# Patient Record
Sex: Female | Born: 1985 | Race: Black or African American | Hispanic: No | Marital: Single | Smoking: Current some day smoker
Health system: Southern US, Community
[De-identification: ages and names within clinical notes are randomized; demographics above are authoritative.]

## PROBLEM LIST (undated history)

## (undated) DIAGNOSIS — Q501 Developmental ovarian cyst: Secondary | ICD-10-CM

## (undated) DIAGNOSIS — J45909 Unspecified asthma, uncomplicated: Secondary | ICD-10-CM

## (undated) DIAGNOSIS — F419 Anxiety disorder, unspecified: Secondary | ICD-10-CM

## (undated) DIAGNOSIS — I10 Essential (primary) hypertension: Secondary | ICD-10-CM

## (undated) DIAGNOSIS — D219 Benign neoplasm of connective and other soft tissue, unspecified: Secondary | ICD-10-CM

---
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
## 2008-02-01 ENCOUNTER — Emergency Department (HOSPITAL_COMMUNITY): Admission: EM | Admit: 2008-02-01 | Discharge: 2008-02-01 | Payer: Self-pay | Admitting: Family Medicine

## 2008-04-11 ENCOUNTER — Emergency Department (HOSPITAL_COMMUNITY): Admission: EM | Admit: 2008-04-11 | Discharge: 2008-04-11 | Payer: Self-pay | Admitting: Emergency Medicine

## 2008-10-18 ENCOUNTER — Emergency Department (HOSPITAL_COMMUNITY): Admission: EM | Admit: 2008-10-18 | Discharge: 2008-10-18 | Payer: Self-pay | Admitting: Emergency Medicine

## 2008-10-22 ENCOUNTER — Inpatient Hospital Stay (HOSPITAL_COMMUNITY): Admission: AD | Admit: 2008-10-22 | Discharge: 2008-10-22 | Payer: Self-pay | Admitting: Obstetrics & Gynecology

## 2008-12-26 ENCOUNTER — Emergency Department (HOSPITAL_COMMUNITY): Admission: EM | Admit: 2008-12-26 | Discharge: 2008-12-26 | Payer: Self-pay | Admitting: Emergency Medicine

## 2009-01-23 ENCOUNTER — Emergency Department (HOSPITAL_COMMUNITY): Admission: EM | Admit: 2009-01-23 | Discharge: 2009-01-23 | Payer: Self-pay | Admitting: Emergency Medicine

## 2009-06-22 ENCOUNTER — Emergency Department (HOSPITAL_COMMUNITY): Admission: EM | Admit: 2009-06-22 | Discharge: 2009-06-22 | Payer: Self-pay | Admitting: Emergency Medicine

## 2009-10-31 ENCOUNTER — Emergency Department (HOSPITAL_COMMUNITY): Admission: EM | Admit: 2009-10-31 | Discharge: 2009-10-31 | Payer: Self-pay | Admitting: Emergency Medicine

## 2009-12-19 ENCOUNTER — Emergency Department (HOSPITAL_COMMUNITY): Admission: EM | Admit: 2009-12-19 | Discharge: 2009-12-19 | Payer: Self-pay | Admitting: Family Medicine

## 2010-11-01 ENCOUNTER — Emergency Department (HOSPITAL_COMMUNITY)
Admission: EM | Admit: 2010-11-01 | Discharge: 2010-11-02 | Payer: Self-pay | Source: Home / Self Care | Admitting: Emergency Medicine

## 2010-11-04 LAB — URINALYSIS, ROUTINE W REFLEX MICROSCOPIC
Bilirubin Urine: NEGATIVE
Nitrite: NEGATIVE
Specific Gravity, Urine: 1.02 (ref 1.005–1.030)
pH: 6.5 (ref 5.0–8.0)

## 2010-11-04 LAB — POCT PREGNANCY, URINE: Preg Test, Ur: NEGATIVE

## 2010-12-28 LAB — URINALYSIS, ROUTINE W REFLEX MICROSCOPIC
Nitrite: NEGATIVE
Protein, ur: NEGATIVE mg/dL
Specific Gravity, Urine: 1.03 (ref 1.005–1.030)
Urobilinogen, UA: 0.2 mg/dL (ref 0.0–1.0)

## 2010-12-28 LAB — POCT PREGNANCY, URINE: Preg Test, Ur: NEGATIVE

## 2011-01-04 LAB — WET PREP, GENITAL
Clue Cells Wet Prep HPF POC: NONE SEEN
Trich, Wet Prep: NONE SEEN
Yeast Wet Prep HPF POC: NONE SEEN

## 2011-01-04 LAB — POCT URINALYSIS DIP (DEVICE)
Glucose, UA: NEGATIVE mg/dL
Nitrite: NEGATIVE
Protein, ur: NEGATIVE mg/dL
Urobilinogen, UA: 0.2 mg/dL (ref 0.0–1.0)

## 2011-01-04 LAB — GC/CHLAMYDIA PROBE AMP, GENITAL: GC Probe Amp, Genital: NEGATIVE

## 2011-01-04 LAB — POCT PREGNANCY, URINE: Preg Test, Ur: NEGATIVE

## 2011-01-16 LAB — URINE MICROSCOPIC-ADD ON

## 2011-01-16 LAB — URINALYSIS, ROUTINE W REFLEX MICROSCOPIC
Glucose, UA: NEGATIVE mg/dL
Hgb urine dipstick: NEGATIVE
Specific Gravity, Urine: 1.038 — ABNORMAL HIGH (ref 1.005–1.030)
Urobilinogen, UA: 1 mg/dL (ref 0.0–1.0)

## 2011-01-16 LAB — GC/CHLAMYDIA PROBE AMP, GENITAL
Chlamydia, DNA Probe: POSITIVE — AB
GC Probe Amp, Genital: POSITIVE — AB

## 2011-01-16 LAB — WET PREP, GENITAL
Clue Cells Wet Prep HPF POC: NONE SEEN
Trich, Wet Prep: NONE SEEN

## 2011-01-16 LAB — POCT PREGNANCY, URINE: Preg Test, Ur: NEGATIVE

## 2011-01-21 LAB — BASIC METABOLIC PANEL
CO2: 23 mEq/L (ref 19–32)
Calcium: 9.7 mg/dL (ref 8.4–10.5)
Chloride: 101 mEq/L (ref 96–112)
GFR calc Af Amer: >60 mL/min (ref >60)
Glucose, Bld: 114 mg/dL — ABNORMAL HIGH (ref 70–99)
Potassium: 3.4 mEq/L — ABNORMAL LOW (ref 3.5–5.1)
Sodium: 135 mEq/L (ref 135–145)

## 2011-01-21 LAB — URINALYSIS, ROUTINE W REFLEX MICROSCOPIC
Glucose, UA: NEGATIVE mg/dL
Ketones, ur: 15 mg/dL — AB
Nitrite: NEGATIVE
Protein, ur: NEGATIVE mg/dL
Urobilinogen, UA: 0.2 mg/dL (ref 0.0–1.0)

## 2011-01-21 LAB — WET PREP, GENITAL: Clue Cells Wet Prep HPF POC: NONE SEEN

## 2011-01-21 LAB — DIFFERENTIAL
Basophils Relative: 1 % (ref 0–1)
Eosinophils Relative: 2 % (ref 0–5)
Monocytes Absolute: 0.6 10*3/uL (ref 0.1–1.0)
Monocytes Relative: 6 % (ref 3–12)
Neutro Abs: 7.8 10*3/uL — ABNORMAL HIGH (ref 1.7–7.7)

## 2011-01-21 LAB — URINE MICROSCOPIC-ADD ON

## 2011-01-21 LAB — CBC
HCT: 42 % (ref 36.0–46.0)
Hemoglobin: 13.9 g/dL (ref 12.0–15.0)
MCHC: 33.2 g/dL (ref 30.0–36.0)
MCV: 79.6 fL (ref 78.0–100.0)
RBC: 5.28 MIL/uL — ABNORMAL HIGH (ref 3.87–5.11)
RDW: 14.1 % (ref 11.5–15.5)

## 2011-01-21 LAB — GC/CHLAMYDIA PROBE AMP, GENITAL: Chlamydia, DNA Probe: POSITIVE — AB

## 2011-01-26 LAB — POCT PREGNANCY, URINE: Preg Test, Ur: NEGATIVE

## 2011-06-30 ENCOUNTER — Emergency Department (HOSPITAL_COMMUNITY)
Admission: EM | Admit: 2011-06-30 | Discharge: 2011-07-01 | Disposition: A | Discharge status: Home / Self Care | Payer: Self-pay | Attending: Emergency Medicine | Admitting: Emergency Medicine

## 2011-06-30 DIAGNOSIS — R197 Diarrhea, unspecified: Secondary | ICD-10-CM | POA: Insufficient documentation

## 2011-06-30 DIAGNOSIS — R112 Nausea with vomiting, unspecified: Secondary | ICD-10-CM | POA: Insufficient documentation

## 2011-06-30 DIAGNOSIS — R109 Unspecified abdominal pain: Secondary | ICD-10-CM | POA: Insufficient documentation

## 2011-06-30 DIAGNOSIS — R51 Headache: Secondary | ICD-10-CM | POA: Insufficient documentation

## 2011-06-30 DIAGNOSIS — R6883 Chills (without fever): Secondary | ICD-10-CM | POA: Insufficient documentation

## 2011-07-01 LAB — DIFFERENTIAL
Basophils Absolute: 0 10*3/uL (ref 0.0–0.1)
Basophils Relative: 0 % (ref 0–1)
Eosinophils Absolute: 0.2 10*3/uL (ref 0.0–0.7)
Eosinophils Relative: 2 % (ref 0–5)
Monocytes Absolute: 1 10*3/uL (ref 0.1–1.0)
Monocytes Relative: 8 % (ref 3–12)

## 2011-07-01 LAB — CBC
Hemoglobin: 12.3 g/dL (ref 12.0–15.0)
MCH: 26.2 pg (ref 26.0–34.0)
MCHC: 34.1 g/dL (ref 30.0–36.0)
Platelets: 259 10*3/uL (ref 150–400)
RDW: 14.2 % (ref 11.5–15.5)

## 2011-07-01 LAB — BASIC METABOLIC PANEL
Calcium: 9.7 mg/dL (ref 8.4–10.5)
GFR calc Af Amer: >60 mL/min (ref >60)
GFR calc non Af Amer: >60 mL/min (ref >60)
Glucose, Bld: 93 mg/dL (ref 70–99)
Potassium: 3.2 mEq/L — ABNORMAL LOW (ref 3.5–5.1)
Sodium: 136 mEq/L (ref 135–145)

## 2011-07-01 LAB — URINALYSIS, ROUTINE W REFLEX MICROSCOPIC
Bilirubin Urine: NEGATIVE
Ketones, ur: 15 mg/dL — AB
Leukocytes, UA: NEGATIVE
Nitrite: NEGATIVE
Protein, ur: NEGATIVE mg/dL
Urobilinogen, UA: 1 mg/dL (ref 0.0–1.0)
pH: 7 (ref 5.0–8.0)

## 2011-07-07 LAB — POCT URINALYSIS DIP (DEVICE)
Ketones, ur: NEGATIVE
Protein, ur: NEGATIVE
Urobilinogen, UA: 0.2
pH: 5.5

## 2011-07-07 LAB — POCT PREGNANCY, URINE: Operator id: 239701

## 2011-07-09 LAB — DIFFERENTIAL
Basophils Relative: 1
Eosinophils Absolute: 0.2
Eosinophils Relative: 2
Monocytes Absolute: 0.5
Monocytes Relative: 7

## 2011-07-09 LAB — POCT URINALYSIS DIP (DEVICE)
Bilirubin Urine: NEGATIVE
Glucose, UA: NEGATIVE
Hgb urine dipstick: NEGATIVE
Specific Gravity, Urine: 1.015
Urobilinogen, UA: 1
pH: 8

## 2011-07-09 LAB — POCT I-STAT, CHEM 8
Calcium, Ion: 1.15
Glucose, Bld: 95
HCT: 44
Hemoglobin: 15
TCO2: 26

## 2011-07-09 LAB — CBC
Hemoglobin: 13.8
MCHC: 32.9
MCV: 80.7
RBC: 5.2 — ABNORMAL HIGH

## 2012-06-24 ENCOUNTER — Encounter (HOSPITAL_COMMUNITY): Payer: Self-pay

## 2012-06-24 ENCOUNTER — Emergency Department (HOSPITAL_COMMUNITY): Payer: Self-pay

## 2012-06-24 ENCOUNTER — Emergency Department (HOSPITAL_COMMUNITY)
Admission: EM | Admit: 2012-06-24 | Discharge: 2012-06-24 | Disposition: A | Discharge status: Home / Self Care | Payer: Self-pay | Attending: Emergency Medicine | Admitting: Emergency Medicine

## 2012-06-24 DIAGNOSIS — N898 Other specified noninflammatory disorders of vagina: Secondary | ICD-10-CM | POA: Insufficient documentation

## 2012-06-24 DIAGNOSIS — I10 Essential (primary) hypertension: Secondary | ICD-10-CM | POA: Insufficient documentation

## 2012-06-24 DIAGNOSIS — R109 Unspecified abdominal pain: Secondary | ICD-10-CM | POA: Insufficient documentation

## 2012-06-24 DIAGNOSIS — R112 Nausea with vomiting, unspecified: Secondary | ICD-10-CM | POA: Insufficient documentation

## 2012-06-24 DIAGNOSIS — N730 Acute parametritis and pelvic cellulitis: Secondary | ICD-10-CM | POA: Insufficient documentation

## 2012-06-24 HISTORY — DX: Essential (primary) hypertension: I10

## 2012-06-24 LAB — CBC WITH DIFFERENTIAL/PLATELET
Eosinophils Absolute: 0.2 10*3/uL (ref 0.0–0.7)
Eosinophils Relative: 3 % (ref 0–5)
Lymphs Abs: 2.7 10*3/uL (ref 0.7–4.0)
MCH: 26.6 pg (ref 26.0–34.0)
MCV: 78.9 fL (ref 78.0–100.0)
Platelets: 278 10*3/uL (ref 150–400)
RBC: 5.3 MIL/uL — ABNORMAL HIGH (ref 3.87–5.11)

## 2012-06-24 LAB — URINALYSIS, ROUTINE W REFLEX MICROSCOPIC
Bilirubin Urine: NEGATIVE
Ketones, ur: NEGATIVE mg/dL
Nitrite: NEGATIVE
Urobilinogen, UA: 1 mg/dL (ref 0.0–1.0)
pH: 6 (ref 5.0–8.0)

## 2012-06-24 LAB — COMPREHENSIVE METABOLIC PANEL
ALT: 11 U/L (ref 0–35)
BUN: 11 mg/dL (ref 6–23)
Calcium: 9.7 mg/dL (ref 8.4–10.5)
Creatinine, Ser: 0.85 mg/dL (ref 0.50–1.10)
GFR calc Af Amer: >90 mL/min (ref >90)
Glucose, Bld: 86 mg/dL (ref 70–99)
Sodium: 137 mEq/L (ref 135–145)
Total Protein: 7.6 g/dL (ref 6.0–8.3)

## 2012-06-24 LAB — WET PREP, GENITAL
Trich, Wet Prep: NONE SEEN
Yeast Wet Prep HPF POC: NONE SEEN

## 2012-06-24 MED ORDER — HYDROMORPHONE HCL PF 1 MG/ML IJ SOLN
1.0000 mg | Freq: Once | INTRAMUSCULAR | Status: AC
  Administered 2012-06-24: 1 mg via INTRAMUSCULAR
  Filled 2012-06-24: qty 1

## 2012-06-24 MED ORDER — CEFTRIAXONE SODIUM 250 MG IJ SOLR
250.0000 mg | Freq: Once | INTRAMUSCULAR | Status: AC
  Administered 2012-06-24: 250 mg via INTRAMUSCULAR
  Filled 2012-06-24: qty 250

## 2012-06-24 MED ORDER — ONDANSETRON 4 MG PO TBDP
4.0000 mg | ORAL_TABLET | Freq: Once | ORAL | Status: AC
  Administered 2012-06-24: 4 mg via ORAL

## 2012-06-24 MED ORDER — ONDANSETRON 4 MG PO TBDP
ORAL_TABLET | ORAL | Status: AC
  Administered 2012-06-24: 4 mg via ORAL
  Filled 2012-06-24: qty 1

## 2012-06-24 MED ORDER — DOXYCYCLINE HYCLATE 100 MG PO CAPS: 100.0000 mg | ORAL_CAPSULE | Freq: Two times a day (BID) | ORAL | Status: AC

## 2012-06-24 MED ORDER — ONDANSETRON HCL 4 MG PO TABS: 4.0000 mg | ORAL_TABLET | Freq: Four times a day (QID) | ORAL | Status: AC

## 2012-06-24 MED ORDER — AZITHROMYCIN 250 MG PO TABS
1000.0000 mg | ORAL_TABLET | Freq: Once | ORAL | Status: AC
  Administered 2012-06-24: 1000 mg via ORAL
  Filled 2012-06-24: qty 4

## 2012-06-24 MED ORDER — ONDANSETRON 4 MG PO TBDP
4.0000 mg | ORAL_TABLET | Freq: Once | ORAL | Status: AC
Start: 2012-06-24 — End: 2012-06-24
  Administered 2012-06-24: 4 mg via ORAL

## 2012-06-24 NOTE — ED Notes (Signed)
Pt to ultrasound at this time. No distress noted.

## 2012-06-24 NOTE — ED Provider Notes (Signed)
History     CSN: [id number]  Arrival date & time 06/24/12  1317   First MD Initiated Contact with Patient 06/24/12 1432      Chief Complaint  Patient presents with  . Nausea    (Consider location/radiation/quality/duration/timing/severity/associated sxs/prior treatment) HPI Comments: 26 year old female presents with abdominal pain, nausea and vomiting x2 days. Abdominal pains located in her lower abdomen and in her bellybutton. She rates the pain a 9/10 and is constant radiating to her back.. She has been nauseous and vomited a few times. Admits to associated vaginal bleeding. She states is not a lot of blood like her menstrual, just some spotting. Admits to being sexually active with one partner, does not use protection and is not on any birth control. She is feeling a little weak. Denies any dysuria, increased frequency or urgency. Denies fever, chills, lightheadedness, dizziness or headache. She has never been pregnant before. Her menstrual cycles are usually regular.  The history is provided by the patient.    Past Medical History  Diagnosis Date  . Hypertension     History reviewed. No pertinent past surgical history.  History reviewed. No pertinent family history.  History  Substance Use Topics  . Smoking status: Never Smoker   . Smokeless tobacco: Not on file  . Alcohol Use: No    OB History    Grav Para Term Preterm Abortions TAB SAB Ect Mult Living                  Review of Systems  Constitutional: Negative for fever and chills.  Respiratory: Negative for shortness of breath.   Cardiovascular: Negative for chest pain.  Gastrointestinal: Positive for nausea, vomiting and abdominal pain.  Genitourinary: Positive for vaginal bleeding, menstrual problem and pelvic pain. Negative for dysuria, urgency, frequency and vaginal pain.  Musculoskeletal: Positive for back pain.  Skin: Negative for color change.  Neurological: Positive for weakness. Negative for  dizziness and light-headedness.    Allergies  Review of patient's allergies indicates no known allergies.  Home Medications  No current outpatient prescriptions on file.  LMP 06/24/2012  Physical Exam  Constitutional: She is oriented to person, place, and time. She appears well-developed and well-nourished. No distress.  HENT:  Head: Normocephalic and atraumatic.  Eyes: Conjunctivae normal are normal.  Neck: Normal range of motion. Neck supple.  Cardiovascular: Normal rate, regular rhythm and normal heart sounds.   Pulmonary/Chest: Effort normal and breath sounds normal.  Abdominal: Soft. Bowel sounds are normal. There is tenderness in the periumbilical area and suprapubic area. There is no CVA tenderness.  Genitourinary: Uterus normal. Cervix exhibits motion tenderness and discharge. Cervix exhibits no friability. Right adnexum displays tenderness. Right adnexum displays no mass and no fullness. Left adnexum displays tenderness. Left adnexum displays no mass and no fullness. There is erythema and bleeding around the vagina. No tenderness around the vagina. Vaginal discharge (Malodorous) found.  Musculoskeletal: Normal range of motion. She exhibits no edema.  Neurological: She is alert and oriented to person, place, and time.  Skin: Skin is warm and dry. She is not diaphoretic.  Psychiatric: She has a normal mood and affect. Her behavior is normal.    ED Course  Procedures (including critical care time)   Labs Reviewed  POCT PREGNANCY, URINE  URINALYSIS, ROUTINE W REFLEX MICROSCOPIC  CBC WITH DIFFERENTIAL  COMPREHENSIVE METABOLIC PANEL  GC/CHLAMYDIA PROBE AMP, GENITAL  WET PREP, GENITAL   No results found.   No diagnosis found.    MDM  26 y/o female with abdominal/pelvic pain and vaginal bleeding. CMT present on pelvic exam along with malodorous discharge. Obtaining US pelvis due to adnexal tenderness bilaterally. Case discussed with Remi Haggard, PA-C who will take  over care of patient at this time.        Trevor Mace, PA-C 06/24/12 1549

## 2012-06-24 NOTE — ED Notes (Signed)
Pt c/o nausea, given zofran as ordered, will reassess prior to discharge.

## 2012-06-24 NOTE — ED Provider Notes (Signed)
1545 pm report received from [place] PA for this 26 year old complaining of nausea vomiting and lower abdominal pain. Patient has pending u/s to rule out adnexal abscess. Patient will be treated for PID. Disposition pending after ultrasound results.   1800  U/s negative for any acute process.  Patient will be discharged and follow up with pcp/gyn of choice or [facility].  No intercourse until rechecked in 5-7 days.  Partner needs to go to free std clinic as well for treatment. rx for doxycycline.  Remi Haggard, NP 06/25/12 1303

## 2012-06-24 NOTE — ED Notes (Signed)
Pt returned from ultrasound, no distress noted.

## 2012-06-24 NOTE — ED Notes (Signed)
Pt complains of nausea, and sts possible mis carriage but has not taken a pregnancy test

## 2012-06-24 NOTE — ED Notes (Signed)
Pt has not taken a preg test but Pt thinks she may be having a miscarriage.  Pt reports clots looking more like formed tissue than normal clots.  Pt alert oriented X4.

## 2012-06-27 LAB — GC/CHLAMYDIA PROBE AMP, GENITAL
Chlamydia, DNA Probe: NEGATIVE
GC Probe Amp, Genital: NEGATIVE

## 2012-06-27 NOTE — ED Provider Notes (Signed)
Medical screening examination/treatment/procedure(s) were performed by non-physician practitioner and as supervising physician I was immediately available for consultation/collaboration.   Kailee Essman M Orlinda Slomski, DO 06/27/12 0820 

## 2012-06-28 NOTE — ED Provider Notes (Signed)
Medical screening examination/treatment/procedure(s) were performed by non-physician practitioner and as supervising physician I was immediately available for consultation/collaboration.   Laray Anger, DO 06/28/12 1422

## 2013-01-30 ENCOUNTER — Encounter (HOSPITAL_COMMUNITY): Payer: Self-pay | Admitting: Emergency Medicine

## 2013-01-30 ENCOUNTER — Emergency Department (HOSPITAL_COMMUNITY)
Admission: EM | Admit: 2013-01-30 | Discharge: 2013-01-30 | Disposition: A | Discharge status: Home / Self Care | Payer: Self-pay | Attending: Emergency Medicine | Admitting: Emergency Medicine

## 2013-01-30 ENCOUNTER — Emergency Department (HOSPITAL_COMMUNITY): Payer: Self-pay

## 2013-01-30 DIAGNOSIS — Y929 Unspecified place or not applicable: Secondary | ICD-10-CM | POA: Insufficient documentation

## 2013-01-30 DIAGNOSIS — S20219A Contusion of unspecified front wall of thorax, initial encounter: Secondary | ICD-10-CM | POA: Insufficient documentation

## 2013-01-30 DIAGNOSIS — Y939 Activity, unspecified: Secondary | ICD-10-CM | POA: Insufficient documentation

## 2013-01-30 DIAGNOSIS — W010XXA Fall on same level from slipping, tripping and stumbling without subsequent striking against object, initial encounter: Secondary | ICD-10-CM | POA: Insufficient documentation

## 2013-01-30 DIAGNOSIS — S20212A Contusion of left front wall of thorax, initial encounter: Secondary | ICD-10-CM

## 2013-01-30 MED ORDER — OXYCODONE-ACETAMINOPHEN 5-325 MG PO TABS: ORAL_TABLET | ORAL | Status: DC

## 2013-01-30 MED ORDER — OXYCODONE-ACETAMINOPHEN 5-325 MG PO TABS
2.0000 | ORAL_TABLET | Freq: Once | ORAL | Status: AC
  Administered 2013-01-30: 2 via ORAL
  Filled 2013-01-30: qty 2

## 2013-01-30 NOTE — ED Provider Notes (Signed)
Medical screening examination/treatment/procedure(s) were performed by non-physician practitioner and as supervising physician I was immediately available for consultation/collaboration.    Saga Balthazar R Orly Quimby, MD 01/30/13 2231 

## 2013-01-30 NOTE — ED Notes (Addendum)
Pt tripped and fell today at home and is having rib pain on the left side that hurts more with each breat. Pain 7/10. Pt fell yesterday.

## 2013-01-30 NOTE — ED Provider Notes (Signed)
History    This chart was scribed for non-physician practitioner Wynetta Emery, PA-C working with Celene Kras, MD by Gerlean Ren, ED Scribe. This patient was seen in room WTR9/WTR9 and the patient's care was started at 4:53 PM.     CSN: [id number]  Arrival date & time 01/30/13  1511   First MD Initiated Contact with Patient 01/30/13 1606      No chief complaint on file.    The history is provided by the patient. No language interpreter was used.  Kim Williams is a 27 y.o. female who presents to the Emergency Department complaining of constant left lateral rib pain since tripping and falling yesterday evening from standing position on indoor floor.  Pain rated as 7/10.  Pain worsened with breathing and temporarily improved by 400mg  ibuprofen taken this morning.  Pt reports minor head trauma but states her hair cushioned her head.  Pt denies LOC, neck pain, nausea, emesis, severe HA, dizziness.   Past Medical History  Diagnosis Date  . Hypertension     History reviewed. No pertinent past surgical history.  No family history on file.  History  Substance Use Topics  . Smoking status: Never Smoker   . Smokeless tobacco: Not on file  . Alcohol Use: No    No OB history provided.   Review of Systems  Constitutional: Negative for fever.  HENT: Negative for neck pain.   Respiratory: Negative for shortness of breath.   Cardiovascular: Negative for chest pain.  Gastrointestinal: Negative for nausea, vomiting, abdominal pain and diarrhea.  Musculoskeletal: Positive for myalgias.  Neurological: Negative for dizziness and headaches.  All other systems reviewed and are negative.    Allergies  Review of patient's allergies indicates no known allergies.  Home Medications   Current Outpatient Rx  Name  Route  Sig  Dispense  Refill  . ibuprofen (ADVIL,MOTRIN) 200 MG tablet   Oral   Take 400 mg by mouth every 6 (six) hours as needed for pain.           BP 123/72   Pulse 82  Temp(Src) 98.8 F (37.1 C)  Resp 18  SpO2 100%  LMP 12/27/2012  Physical Exam  Nursing note and vitals reviewed. Constitutional: She is oriented to person, place, and time. She appears well-developed and well-nourished. No distress.  HENT:  Head: Normocephalic and atraumatic.  Mouth/Throat: Oropharynx is clear and moist.  Eyes: Conjunctivae and EOM are normal. Pupils are equal, round, and reactive to light.  Neck: Normal range of motion.  No midline tenderness to palpation or step-offs appreciated. Patient has full range of motion without pain.   Cardiovascular: Normal rate and intact distal pulses.   Pulmonary/Chest: Effort normal and breath sounds normal. No stridor. No respiratory distress. She has no wheezes. She has no rales. She exhibits tenderness.  Tenderness to palpation in lower mid-axillary line on left side with no crepitance.    Abdominal: Soft. There is no tenderness.  Musculoskeletal: Normal range of motion.  Neurological: She is alert and oriented to person, place, and time.  It is 5 out of 5x4 extremities, distal sensation is grossly intact.  Psychiatric: She has a normal mood and affect.    ED Course  Procedures (including critical care time) DIAGNOSTIC STUDIES: Oxygen Saturation is 100% on room air, normal by my interpretation.    COORDINATION OF CARE: 4:58 PM- Informed pt that regardless of XR results, treatment will be aggressive pain reduction.  Discussed with pt that it  is important to take deep breaths to prevent developing pneumonia.  Pt verbalizes understanding and agrees with plan.     Dg Ribs Unilateral W/chest Left  01/30/2013  *RADIOLOGY REPORT*  Clinical Data: Fall.  Left rib and chest pain.  LEFT RIBS AND CHEST - 3+ VIEW  Comparison:  Chest radiograph on 11/01/2010  Findings:  No fracture or other bone lesions are seen involving the ribs. There is no evidence of pneumothorax or pleural effusion. Both lungs are clear.  Heart size and  mediastinal contours are within normal limits.  IMPRESSION: Negative.   Original Report Authenticated By: Myles Rosenthal, M.D.      1. Rib contusion, left, initial encounter       MDM   Kim Williams is a 27 y.o. female with left-sided rib pain status post slip and fall. Physical exam shows a tenderness with no crepitance, chest x-ray shows no rib fractures or pneumothorax.  Filed Vitals:   01/30/13 1544  BP: 123/72  Pulse: 82  Temp: 98.8 F (37.1 C)  Resp: 18  SpO2: 100%     Pt verbalized understanding and agrees with care plan. Outpatient follow-up and return precautions given.    New Prescriptions   OXYCODONE-ACETAMINOPHEN (PERCOCET/ROXICET) 5-325 MG PER TABLET    1 to 2 tabs PO q6hrs  PRN for pain    I personally performed the services described in this documentation, which was scribed in my presence. The recorded information has been reviewed and is accurate.     Wynetta Emery, PA-C 01/30/13 1705

## 2013-03-10 ENCOUNTER — Institutional Professional Consult (permissible substitution): Payer: Self-pay | Admitting: Medical

## 2013-03-16 ENCOUNTER — Emergency Department (HOSPITAL_COMMUNITY): Payer: BC Managed Care – PPO

## 2013-03-16 ENCOUNTER — Encounter (HOSPITAL_COMMUNITY): Payer: Self-pay | Admitting: Family Medicine

## 2013-03-16 ENCOUNTER — Emergency Department (HOSPITAL_COMMUNITY)
Admission: EM | Admit: 2013-03-16 | Discharge: 2013-03-16 | Disposition: A | Discharge status: Home / Self Care | Payer: BC Managed Care – PPO | Attending: Emergency Medicine | Admitting: Emergency Medicine

## 2013-03-16 DIAGNOSIS — Z8659 Personal history of other mental and behavioral disorders: Secondary | ICD-10-CM | POA: Insufficient documentation

## 2013-03-16 DIAGNOSIS — I10 Essential (primary) hypertension: Secondary | ICD-10-CM | POA: Insufficient documentation

## 2013-03-16 DIAGNOSIS — R0789 Other chest pain: Secondary | ICD-10-CM

## 2013-03-16 DIAGNOSIS — D219 Benign neoplasm of connective and other soft tissue, unspecified: Secondary | ICD-10-CM

## 2013-03-16 HISTORY — DX: Anxiety disorder, unspecified: F41.9

## 2013-03-16 LAB — BASIC METABOLIC PANEL
BUN: 16 mg/dL (ref 6–23)
Calcium: 9.6 mg/dL (ref 8.4–10.5)
GFR calc Af Amer: >90 mL/min (ref >90)
GFR calc non Af Amer: 84 mL/min — ABNORMAL LOW (ref >90)
Potassium: 4.1 mEq/L (ref 3.5–5.1)
Sodium: 136 mEq/L (ref 135–145)

## 2013-03-16 LAB — CBC
HCT: 40.5 % (ref 36.0–46.0)
MCHC: 32.8 g/dL (ref 30.0–36.0)
RDW: 14.6 % (ref 11.5–15.5)

## 2013-03-16 MED ORDER — ACETAMINOPHEN 325 MG PO TABS
650.0000 mg | ORAL_TABLET | Freq: Once | ORAL | Status: AC
  Administered 2013-03-16: 650 mg via ORAL
  Filled 2013-03-16: qty 2

## 2013-03-16 MED ORDER — IBUPROFEN 800 MG PO TABS
800.0000 mg | ORAL_TABLET | Freq: Once | ORAL | Status: AC
  Administered 2013-03-16: 800 mg via ORAL
  Filled 2013-03-16: qty 1

## 2013-03-16 NOTE — ED Provider Notes (Signed)
History     CSN: [id number]  Arrival date & time 03/16/13  1318   First MD Initiated Contact with Patient 03/16/13 1417      Chief Complaint  Patient presents with  . Chest Pain    (Consider location/radiation/quality/duration/timing/severity/associated sxs/prior treatment) HPI  Kim Williams is a 27 y.o. female  with past medical history significant for hypertension and anxiety who reports a sharp, pleuritic chest pain occurring earlier this morning while at work. She states that this may be related to her anxiety which was increased at that time. She rates the pain at 10 out of 10, and is exacerbated by deep breathing and palpation. She denies any family history of early cardiac death, recent long trips or surgeries, leg swelling or calf tenderness.  Past Medical History  Diagnosis Date  . Hypertension   . Anxiety     History reviewed. No pertinent past surgical history.  History reviewed. No pertinent family history.  History  Substance Use Topics  . Smoking status: Never Smoker   . Smokeless tobacco: Not on file  . Alcohol Use: No    OB History   Grav Para Term Preterm Abortions TAB SAB Ect Mult Living                  Review of Systems  Constitutional: Negative for fever.  Respiratory: Negative for shortness of breath.   Cardiovascular: Positive for chest pain.  Gastrointestinal: Negative for nausea, vomiting, abdominal pain and diarrhea.  All other systems reviewed and are negative.    Allergies  Review of patient's allergies indicates no known allergies.  Home Medications   Current Outpatient Rx  Name  Route  Sig  Dispense  Refill  . ibuprofen (ADVIL,MOTRIN) 200 MG tablet   Oral   Take 400 mg by mouth every 6 (six) hours as needed for pain.           BP 134/84  Pulse 70  Temp(Src) 98.3 F (36.8 C) (Oral)  Resp 14  SpO2 100%  LMP 02/22/2013  Physical Exam  Nursing note and vitals reviewed. Constitutional: She is oriented to  person, place, and time. She appears well-developed and well-nourished. No distress.  HENT:  Head: Normocephalic.  Mouth/Throat: Oropharynx is clear and moist.  Eyes: Conjunctivae and EOM are normal. Pupils are equal, round, and reactive to light.  Neck: Normal range of motion.  Cardiovascular: Normal rate.   Pulmonary/Chest: Effort normal and breath sounds normal. No stridor. No respiratory distress. She has no wheezes. She has no rales. She exhibits tenderness.  Chest pain is perfectly reproducible to palpation of the sternum.  Abdominal: Soft. Bowel sounds are normal. She exhibits no distension and no mass. There is no tenderness. There is no rebound and no guarding.  Musculoskeletal: Normal range of motion.  No calf asymmetry, superficial collaterals, palpable cords, edema, Homans sign negative bilaterally.    Neurological: She is alert and oriented to person, place, and time.  Psychiatric: She has a normal mood and affect.    ED Course  Procedures (including critical care time)  Labs Reviewed  CBC - Abnormal; Notable for the following:    RBC 5.16 (*)    MCH 25.8 (*)    All other components within normal limits  BASIC METABOLIC PANEL - Abnormal; Notable for the following:    GFR calc non Af Amer 84 (*)    All other components within normal limits  POCT I-STAT TROPONIN I   Dg Chest 2 View  03/16/2013   *RADIOLOGY REPORT*  Clinical Data: 27 year old female with chest pain.  Hypertension.  CHEST - 2 VIEW  Comparison: 11/01/2010.  Findings: Stable and normal lung volumes. Normal cardiac size and mediastinal contours.  Visualized tracheal air column is within normal limits.  The lungs are clear.  No pneumothorax or effusion. Mild thoracic scoliosis may be positional.  IMPRESSION: Negative, no acute cardiopulmonary abnormality.   Original Report Authenticated By: Erskine Speed, M.D.    Date: 03/16/2013  Rate: 79  Rhythm: normal sinus rhythm  QRS Axis: normal  Intervals: normal  ST/T  Wave abnormalities: normal  Conduction Disutrbances:none  Narrative Interpretation:   Old EKG Reviewed: None available   1. Atypical chest pain   2. Fibroid       MDM   Filed Vitals:   03/16/13 1326 03/16/13 1411 03/16/13 1525  BP: 133/77 134/84 130/77  Pulse: 70  64  Temp: 98.3 F (36.8 C)  99 F (37.2 C)  TempSrc: Oral  Oral  Resp: 20 14 16   SpO2: 100% 100% 100%     Kim Williams is a 27 y.o. female with pleuritic, reproducible chest pain. Patient is PERC negative, EKG is nonischemic, triage initiated troponin is negative. Patient is not anemic and she has no electrolyte abnormalities. Chest x-ray is also negative. Doubt any acute process that requires emergent attention at this time.  Medications  ibuprofen (ADVIL,MOTRIN) tablet 800 mg (800 mg Oral Given 03/16/13 1527)  acetaminophen (TYLENOL) tablet 650 mg (650 mg Oral Given 03/16/13 1527)    The patient is hemodynamically stable, appropriate for, and amenable to, discharge at this time. Pt verbalized understanding and agrees with care plan. Outpatient follow-up and return precautions given.           Joni Reining Latarshia Jersey, PA-C 03/16/13 1600

## 2013-03-16 NOTE — ED Notes (Signed)
Per pt sts sharp pains in her chest while at work today. sts central chest pain. sts hurts with palpation and breathing. Pt tearful sts hx of anxiety.

## 2013-03-18 NOTE — ED Provider Notes (Signed)
Medical screening examination/treatment/procedure(s) were performed by non-physician practitioner and as supervising physician I was immediately available for consultation/collaboration.   Suzi Roots, MD 03/18/13 [phone]

## 2013-06-20 ENCOUNTER — Emergency Department (HOSPITAL_COMMUNITY)
Admission: EM | Admit: 2013-06-20 | Discharge: 2013-06-20 | Disposition: A | Discharge status: Home / Self Care | Payer: BC Managed Care – PPO | Attending: Emergency Medicine | Admitting: Emergency Medicine

## 2013-06-20 ENCOUNTER — Encounter (HOSPITAL_COMMUNITY): Payer: Self-pay | Admitting: *Deleted

## 2013-06-20 DIAGNOSIS — Z3202 Encounter for pregnancy test, result negative: Secondary | ICD-10-CM | POA: Insufficient documentation

## 2013-06-20 DIAGNOSIS — R110 Nausea: Secondary | ICD-10-CM | POA: Insufficient documentation

## 2013-06-20 DIAGNOSIS — I10 Essential (primary) hypertension: Secondary | ICD-10-CM | POA: Insufficient documentation

## 2013-06-20 DIAGNOSIS — R079 Chest pain, unspecified: Secondary | ICD-10-CM | POA: Insufficient documentation

## 2013-06-20 DIAGNOSIS — R51 Headache: Secondary | ICD-10-CM | POA: Insufficient documentation

## 2013-06-20 DIAGNOSIS — Z8659 Personal history of other mental and behavioral disorders: Secondary | ICD-10-CM | POA: Insufficient documentation

## 2013-06-20 LAB — URINALYSIS, ROUTINE W REFLEX MICROSCOPIC
Bilirubin Urine: NEGATIVE
Glucose, UA: NEGATIVE mg/dL
Ketones, ur: NEGATIVE mg/dL
Leukocytes, UA: NEGATIVE
Protein, ur: NEGATIVE mg/dL

## 2013-06-20 MED ORDER — SODIUM CHLORIDE 0.9 % IV BOLUS (SEPSIS)
1000.0000 mL | Freq: Once | INTRAVENOUS | Status: AC
  Administered 2013-06-20: 1000 mL via INTRAVENOUS

## 2013-06-20 MED ORDER — METOCLOPRAMIDE HCL 5 MG/ML IJ SOLN
10.0000 mg | Freq: Once | INTRAMUSCULAR | Status: AC
  Administered 2013-06-20: 10 mg via INTRAVENOUS
  Filled 2013-06-20: qty 2

## 2013-06-20 MED ORDER — DIPHENHYDRAMINE HCL 50 MG/ML IJ SOLN
25.0000 mg | Freq: Once | INTRAMUSCULAR | Status: AC
  Administered 2013-06-20: 25 mg via INTRAVENOUS
  Filled 2013-06-20: qty 1

## 2013-06-20 MED ORDER — DEXAMETHASONE SODIUM PHOSPHATE 10 MG/ML IJ SOLN
10.0000 mg | Freq: Once | INTRAMUSCULAR | Status: AC
  Administered 2013-06-20: 10 mg via INTRAVENOUS
  Filled 2013-06-20: qty 1

## 2013-06-20 NOTE — ED Notes (Signed)
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 Pt states headache since yesterday to right sided of head and reports nausea.  Pt states she has chest heaviness and lower back pain.  LMP August 21st, no pain or burning with urination.

## 2013-06-20 NOTE — ED Notes (Addendum)
Has been under a lot of stress  for months makes her vomit when under stress and then her chest hurts when she breaths. Feels like a weight on chest , for more than 4 months she states . States did drive herself here  N/v started this am . LMP last month 8/21

## 2013-06-20 NOTE — ED Provider Notes (Signed)
CSN: [id number]     Arrival date & time 06/20/13  1044 History   First MD Initiated Contact with Patient 06/20/13 1052     Chief Complaint  Patient presents with  . Chest Pain   (Consider location/radiation/quality/duration/timing/severity/associated sxs/prior Treatment) Patient is a 27 y.o. female presenting with headaches.  Headache Pain location:  Frontal Quality:  Dull Radiates to:  Does not radiate Pain severity now: severe. Onset quality:  Gradual Duration:  1 day Timing:  Constant Progression:  Worsening Chronicity:  New Context: bright light   Relieved by:  Nothing Worsened by:  Activity, light and sound Ineffective treatments:  NSAIDs Associated symptoms: nausea   Associated symptoms: no abdominal pain, no congestion, no cough, no diarrhea, no fever, no neck stiffness and no vomiting     Past Medical History  Diagnosis Date  . Hypertension   . Anxiety    History reviewed. No pertinent past surgical history. No family history on file. History  Substance Use Topics  . Smoking status: Never Smoker   . Smokeless tobacco: Not on file  . Alcohol Use: No   OB History   Grav Para Term Preterm Abortions TAB SAB Ect Mult Living                 Review of Systems  Constitutional: Negative for fever.  HENT: Negative for congestion and neck stiffness.   Respiratory: Negative for cough and shortness of breath.   Cardiovascular: Positive for chest pain (daily for months).  Gastrointestinal: Positive for nausea. Negative for vomiting, abdominal pain and diarrhea.  Neurological: Positive for headaches.  All other systems reviewed and are negative.    Allergies  Review of patient's allergies indicates no known allergies.  Home Medications   Current Outpatient Rx  Name  Route  Sig  Dispense  Refill  . ibuprofen (ADVIL,MOTRIN) 200 MG tablet   Oral   Take 1,200 mg by mouth daily as needed (menstrual cramps).           BP 125/81  Pulse 80  Temp(Src) 99.2 F  (37.3 C) (Oral)  Resp 18  SpO2 100%  LMP 06/01/2013 Physical Exam  Nursing note and vitals reviewed. Constitutional: She is oriented to person, place, and time. She appears well-developed and well-nourished. No distress.  HENT:  Head: Normocephalic and atraumatic.  Mouth/Throat: Oropharynx is clear and moist.  Eyes: Conjunctivae are normal. Pupils are equal, round, and reactive to light. No scleral icterus.  Fundoscopic exam:      The right eye shows no papilledema.       The left eye shows no papilledema.  Neck: Neck supple.  Cardiovascular: Normal rate, regular rhythm, normal heart sounds and intact distal pulses.   No murmur heard. Pulmonary/Chest: Effort normal and breath sounds normal. No stridor. No respiratory distress. She has no rales.  Abdominal: Soft. Bowel sounds are normal. She exhibits no distension. There is no tenderness.  Musculoskeletal: Normal range of motion.  Neurological: She is alert and oriented to person, place, and time. She has normal strength. No cranial nerve deficit or sensory deficit. Coordination and gait normal. GCS eye subscore is 4. GCS verbal subscore is 5. GCS motor subscore is 6.  Reflex Scores:      Patellar reflexes are 2+ on the right side and 2+ on the left side. Skin: Skin is warm and dry. No rash noted.  Psychiatric: She has a normal mood and affect. Her behavior is normal.    ED Course  Procedures (including  critical care time) Labs Review Labs Reviewed  URINALYSIS, ROUTINE W REFLEX MICROSCOPIC - Abnormal; Notable for the following:    APPearance HAZY (*)    All other components within normal limits  GLUCOSE, CAPILLARY - Abnormal; Notable for the following:    Glucose-Capillary 106 (*)    All other components within normal limits  POCT PREGNANCY, URINE   Imaging Review No results found.  EKG - NSR, rate 78, normal axis, normal intervals, nonspecific t wave changes are not significantly changed from prior.    MDM  No diagnosis  found. Pt with multiple complaints, but actual reason for presenting is a gradual onset headache which started yesterday.  Normal neuro exam.  Story not c/w SAH.  Plan IV metoclopramide, diphenhydramine, dexamethasone, and fluids.    HA better.  DC'd home .   Candyce Churn, MD 06/21/13 [phone]

## 2013-07-16 ENCOUNTER — Emergency Department (HOSPITAL_COMMUNITY): Payer: BC Managed Care – PPO

## 2013-07-16 ENCOUNTER — Emergency Department (HOSPITAL_COMMUNITY)
Admission: EM | Admit: 2013-07-16 | Discharge: 2013-07-16 | Disposition: A | Discharge status: Home / Self Care | Payer: BC Managed Care – PPO | Attending: Emergency Medicine | Admitting: Emergency Medicine

## 2013-07-16 ENCOUNTER — Encounter (HOSPITAL_COMMUNITY): Payer: Self-pay | Admitting: *Deleted

## 2013-07-16 DIAGNOSIS — J069 Acute upper respiratory infection, unspecified: Secondary | ICD-10-CM

## 2013-07-16 DIAGNOSIS — B349 Viral infection, unspecified: Secondary | ICD-10-CM

## 2013-07-16 DIAGNOSIS — H9209 Otalgia, unspecified ear: Secondary | ICD-10-CM | POA: Insufficient documentation

## 2013-07-16 DIAGNOSIS — I10 Essential (primary) hypertension: Secondary | ICD-10-CM | POA: Insufficient documentation

## 2013-07-16 DIAGNOSIS — R197 Diarrhea, unspecified: Secondary | ICD-10-CM | POA: Insufficient documentation

## 2013-07-16 DIAGNOSIS — Z8659 Personal history of other mental and behavioral disorders: Secondary | ICD-10-CM | POA: Insufficient documentation

## 2013-07-16 DIAGNOSIS — R112 Nausea with vomiting, unspecified: Secondary | ICD-10-CM | POA: Insufficient documentation

## 2013-07-16 DIAGNOSIS — B9789 Other viral agents as the cause of diseases classified elsewhere: Secondary | ICD-10-CM | POA: Insufficient documentation

## 2013-07-16 MED ORDER — ACETAMINOPHEN 325 MG PO TABS
650.0000 mg | ORAL_TABLET | Freq: Once | ORAL | Status: AC
  Administered 2013-07-16: 650 mg via ORAL
  Filled 2013-07-16: qty 2

## 2013-07-16 MED ORDER — ONDANSETRON HCL 4 MG/2ML IJ SOLN: 4.0000 mg | Freq: Once | INTRAMUSCULAR | Status: DC

## 2013-07-16 MED ORDER — ONDANSETRON HCL 8 MG PO TABS
4.0000 mg | ORAL_TABLET | Freq: Once | ORAL | Status: AC
  Administered 2013-07-16: 4 mg via ORAL
  Filled 2013-07-16: qty 1

## 2013-07-16 NOTE — ED Notes (Signed)
Pt states flu like symptoms for one week with fever.

## 2013-07-16 NOTE — ED Provider Notes (Signed)
CSN: [id number]     Arrival date & time 07/16/13  1953 History  This chart was scribed for non-physician practitioner, Irish Elders, NP working with Dagmar Hait, MD by Greggory Stallion, ED scribe. This patient was seen in room TR09C/TR09C and the patient's care was started at 8:39 PM.   Chief Complaint  Patient presents with  . URI   The history is provided by the patient. No language interpreter was used.   HPI Comments: Kim Williams is a 27 y.o. female who presents to the Emergency Department complaining of rhinorrhea, congestion, body aches and sore throat that started one week ago. She states she also has fever and left ear pain that started 2 days ago. Pt states her fever was 103 at home. Pt had one episode of emesis yesterday and two episodes today after she ate. She has one episode of watery diarrhea. Pt has used Theraflu and robitussin with no relief. She states she has been drinking a lot of fluids. She denies any recent travel. Pt denies any sick exposures. Pt denies difficulty urinating, dysuria, hematuria, frequency and urgency.   Past Medical History  Diagnosis Date  . Hypertension   . Anxiety    History reviewed. No pertinent past surgical history. History reviewed. No pertinent family history. History  Substance Use Topics  . Smoking status: Never Smoker   . Smokeless tobacco: Not on file  . Alcohol Use: No   OB History   Grav Para Term Preterm Abortions TAB SAB Ect Mult Living                 Review of Systems  Constitutional: Positive for fever.  HENT: Positive for congestion, sore throat and rhinorrhea.   Gastrointestinal: Positive for nausea, vomiting and diarrhea.  Genitourinary: Negative for dysuria, urgency, frequency, hematuria and difficulty urinating.  All other systems reviewed and are negative.    Allergies  Review of patient's allergies indicates no known allergies.  Home Medications   Current Outpatient Rx  Name  Route  Sig   Dispense  Refill  . Acetaminophen (TYLENOL PO)   Oral   Take 2 tablets by mouth every 6 (six) hours as needed (for cold symptoms).         . Chlorphen-Pseudoephed-APAP (THERAFLU FLU/COLD PO)   Oral   Take 2 tablets by mouth every 4 (four) hours as needed (for cold symptoms).         Marland Kitchen guaiFENesin (MUCINEX) 600 MG 12 hr tablet   Oral   Take 1,200 mg by mouth 2 (two) times daily as needed for congestion.          BP 137/72  Pulse 98  Temp(Src) 101.5 F (38.6 C) (Oral)  Resp 18  SpO2 100%  LMP 06/01/2013  Physical Exam  Nursing note and vitals reviewed. Constitutional: She is oriented to person, place, and time. She appears well-developed and well-nourished. No distress.  HENT:  Head: Normocephalic and atraumatic.  Mouth/Throat: Uvula is midline.  Mild oropharyngeal erythema. Tonsils grade 1.   Eyes: EOM are normal. Right conjunctiva is injected. Left conjunctiva is injected.  Neck: Neck supple. No tracheal deviation present.  Cardiovascular: Normal rate, regular rhythm and normal heart sounds.   Pulmonary/Chest: Effort normal and breath sounds normal. No respiratory distress. She has no wheezes. She has no rales.  Musculoskeletal: Normal range of motion.  Neurological: She is alert and oriented to person, place, and time.  Skin: Skin is warm and dry.  Psychiatric: She has  a normal mood and affect. Her behavior is normal.    ED Course  Procedures (including critical care time)  DIAGNOSTIC STUDIES: Oxygen Saturation is 100% on RA, normal by my interpretation.    COORDINATION OF CARE: 8:45 PM-Discussed treatment plan which includes nausea and pain medication with pt at bedside and pt agreed to plan.   Labs Review Labs Reviewed - No data to display Imaging Review Dg Chest 2 View  07/16/2013   CLINICAL DATA:  Upper respiratory tract infection  EXAM: CHEST  2 VIEW  COMPARISON:  03/16/2013  FINDINGS: The heart size and mediastinal contours are within normal limits.  Both lungs are clear. The visualized skeletal structures are unremarkable.  IMPRESSION: No active cardiopulmonary disease.   Electronically Signed   By: Alcide Clever M.D.   On: 07/16/2013 20:37    MDM   1. URI (upper respiratory infection)   2. Viral illness    Oral fluids and progress to bland foods when feeling better. Tylenol or ibuprofen for fever. Rest.  No difficulty breathing or shortness of breath. Reassuring exam.  I personally performed the services described in this documentation, which was scribed in my presence. The recorded information has been reviewed and is accurate.   Irish Elders, NP 07/16/13 2128

## 2013-07-16 NOTE — ED Notes (Signed)
Pt discharged.Vital signs stable and GCS 15 

## 2013-07-16 NOTE — ED Provider Notes (Signed)
Medical screening examination/treatment/procedure(s) were performed by non-physician practitioner and as supervising physician I was immediately available for consultation/collaboration.   Dagmar Hait, MD 07/16/13 [phone]

## 2013-07-18 ENCOUNTER — Emergency Department (HOSPITAL_COMMUNITY)
Admission: EM | Admit: 2013-07-18 | Discharge: 2013-07-18 | Disposition: A | Discharge status: Home / Self Care | Payer: BC Managed Care – PPO | Attending: Emergency Medicine | Admitting: Emergency Medicine

## 2013-07-18 ENCOUNTER — Encounter (HOSPITAL_COMMUNITY): Payer: Self-pay | Admitting: Emergency Medicine

## 2013-07-18 DIAGNOSIS — I10 Essential (primary) hypertension: Secondary | ICD-10-CM | POA: Insufficient documentation

## 2013-07-18 DIAGNOSIS — J069 Acute upper respiratory infection, unspecified: Secondary | ICD-10-CM | POA: Insufficient documentation

## 2013-07-18 DIAGNOSIS — H9209 Otalgia, unspecified ear: Secondary | ICD-10-CM | POA: Insufficient documentation

## 2013-07-18 DIAGNOSIS — Z8659 Personal history of other mental and behavioral disorders: Secondary | ICD-10-CM | POA: Insufficient documentation

## 2013-07-18 LAB — RAPID STREP SCREEN (MED CTR MEBANE ONLY): Streptococcus, Group A Screen (Direct): NEGATIVE

## 2013-07-18 MED ORDER — IBUPROFEN 100 MG/5ML PO SUSP
600.0000 mg | Freq: Once | ORAL | Status: AC
  Administered 2013-07-18: 600 mg via ORAL
  Filled 2013-07-18: qty 30

## 2013-07-18 MED ORDER — IBUPROFEN 100 MG/5ML PO SUSP
ORAL | Status: AC
  Administered 2013-07-18: 600 mg via ORAL
  Filled 2013-07-18: qty 30

## 2013-07-18 MED ORDER — PENICILLIN G BENZATHINE 1200000 UNIT/2ML IM SUSP
1.2000 10*6.[IU] | Freq: Once | INTRAMUSCULAR | Status: AC
  Administered 2013-07-18: 1.2 10*6.[IU] via INTRAMUSCULAR
  Filled 2013-07-18: qty 2

## 2013-07-18 MED ORDER — GUAIFENESIN-CODEINE 100-10 MG/5ML PO SOLN: 5.0000 mL | Freq: Three times a day (TID) | ORAL | Status: DC | PRN

## 2013-07-18 NOTE — ED Provider Notes (Signed)
CSN: [id number]     Arrival date & time 07/18/13  1649 History  This chart was scribed for non-physician practitioner Marlon Pel, PA-C working with Juliet Rude. Rubin Payor, MD by Danella Maiers, ED Scribe. This patient was seen in room TR11C/TR11C and the patient's care was started at 5:21 PM.   Chief Complaint  Patient presents with  . Sore Throat  . Otalgia   The history is provided by the patient. No language interpreter was used.   HPI Comments: Kim Williams is a 27 y.o. female who presents to the Emergency Department complaining of sore throat and right ear pain that feels like "water in the ear". She reports flecks of blood in her cough and when she blows her nose. Pt was seen here 2 days ago for the same symptoms and given anti-nausea and pain medication. She states her symptoms have not improved. She has tried many OTC medications with no relief.  She has a h/o strep and states it feels like strep. Pt states she cannot take pills and is requesting the injection.  Past Medical History  Diagnosis Date  . Hypertension   . Anxiety    History reviewed. No pertinent past surgical history. History reviewed. No pertinent family history. History  Substance Use Topics  . Smoking status: Never Smoker   . Smokeless tobacco: Not on file  . Alcohol Use: No   OB History   Grav Para Term Preterm Abortions TAB SAB Ect Mult Living                 Review of Systems  HENT: Positive for ear pain and sore throat.   All other systems reviewed and are negative.  ROS: No TIA's or unusual headaches, no dysphagia.  No prolonged cough. No dyspnea or chest pain on exertion.  No abdominal pain, change in bowel habits, black or bloody stools.  No urinary tract symptoms.  No new or unusual musculoskeletal symptoms.  Normal menses, no abnormal vaginal bleeding, discharge or unexpected pelvic pain. No new breast lumps, breast pain or nipple discharge.   Allergies  Review of patient's allergies  indicates no known allergies.  Home Medications   Current Outpatient Rx  Name  Route  Sig  Dispense  Refill  . Acetaminophen (TYLENOL PO)   Oral   Take 2 tablets by mouth every 6 (six) hours as needed (for cold symptoms).         . Chlorphen-Pseudoephed-APAP (THERAFLU FLU/COLD PO)   Oral   Take 2 tablets by mouth every 4 (four) hours as needed (for cold symptoms).         Marland Kitchen guaiFENesin (MUCINEX) 600 MG 12 hr tablet   Oral   Take 1,200 mg by mouth 2 (two) times daily as needed for congestion.         Marland Kitchen guaiFENesin-codeine 100-10 MG/5ML syrup   Oral   Take 5 mLs by mouth 3 (three) times daily as needed for cough.   120 mL   0    BP 138/60  Pulse 90  Temp(Src) 99.3 F (37.4 C) (Oral)  Resp 18  Ht 5\' 4"  (1.626 m)  Wt 188 lb 8 oz (85.503 kg)  BMI 32.34 kg/m2  SpO2 99%  LMP 07/02/2013 Physical Exam  Nursing note and vitals reviewed. Constitutional: She is oriented to person, place, and time. She appears well-developed and well-nourished. No distress.  HENT:  Head: Normocephalic and atraumatic.  Right Ear: Tympanic membrane, external ear and ear canal normal.  Left Ear: Tympanic membrane, external ear and ear canal normal.  Nose: Nose normal. No rhinorrhea. Right sinus exhibits no maxillary sinus tenderness and no frontal sinus tenderness. Left sinus exhibits no maxillary sinus tenderness and no frontal sinus tenderness.  Mouth/Throat: Uvula is midline and mucous membranes are normal. No trismus in the jaw. Normal dentition. No dental abscesses or edematous. Oropharyngeal exudate and posterior oropharyngeal edema present. No posterior oropharyngeal erythema or tonsillar abscesses.  No submental edema, tongue not elevated, no trismus. No impending airway obstruction; Pt able to speak full sentences, swallow intact, no drooling, stridor, or tonsillar/uvula displacement. No palatal petechia  Eyes: Conjunctivae and EOM are normal.  Neck: Trachea normal, normal range of motion  and full passive range of motion without pain. Neck supple. No rigidity. No tracheal deviation and normal range of motion present. No Brudzinski's sign noted.  Flexion and extension of neck without pain or difficulty. Able to breath without difficulty in extension.  Cardiovascular: Normal rate and regular rhythm.   Pulmonary/Chest: Effort normal and breath sounds normal. No stridor. No respiratory distress. She has no wheezes.  Abdominal: Soft. There is no tenderness.  No obvious evidence of splenomegaly. Non ttp.   Musculoskeletal: Normal range of motion.  Lymphadenopathy:       Head (right side): No preauricular and no posterior auricular adenopathy present.       Head (left side): No preauricular and no posterior auricular adenopathy present.    She has cervical adenopathy.  Neurological: She is alert and oriented to person, place, and time.  Skin: Skin is warm and dry. No rash noted. She is not diaphoretic.  Psychiatric: She has a normal mood and affect. Her behavior is normal.    ED Course  Procedures (including critical care time) Medications  penicillin g benzathine (BICILLIN LA) 1200000 UNIT/2ML injection 1.2 Million Units (not administered)    DIAGNOSTIC STUDIES: Oxygen Saturation is 99% on RA, normal by my interpretation.    COORDINATION OF CARE: 5:39 PM- Discussed treatment plan with pt which includes a shot of penicillin because she cant swallow pills and discharge home with cough syrup and pt agrees to plan.    Labs Review Labs Reviewed  RAPID STREP SCREEN   Imaging Review Dg Chest 2 View  07/16/2013   CLINICAL DATA:  Upper respiratory tract infection  EXAM: CHEST  2 VIEW  COMPARISON:  03/16/2013  FINDINGS: The heart size and mediastinal contours are within normal limits. Both lungs are clear. The visualized skeletal structures are unremarkable.  IMPRESSION: No active cardiopulmonary disease.   Electronically Signed   By: Alcide Clever M.D.   On: 07/16/2013 20:37     MDM   1. URI (upper respiratory infection)         27 y.o.Kim Williams's evaluation in the Emergency Department is complete. It has been determined that no acute conditions requiring further emergency intervention are present at this time. The patient/guardian have been advised of the diagnosis and plan. We have discussed signs and symptoms that warrant return to the ED, such as changes or worsening in symptoms.  Vital signs are stable at discharge. Filed Vitals:   07/18/13 1731  BP: 132/86  Pulse: 84  Temp: 98.7 F (37.1 C)  Resp: 16    Patient/guardian has voiced understanding and agreed to follow-up with the PCP or specialist.  I personally performed the services described in this documentation, which was scribed in my presence. The recorded information has been reviewed and is accurate.  Dorthula Matas, PA-C 07/18/13 1744

## 2013-07-18 NOTE — ED Notes (Signed)
Pt seen here recently for URI that is not improved; pt sts sore throat and right ear pain

## 2013-07-20 LAB — CULTURE, GROUP A STREP

## 2013-07-21 NOTE — ED Provider Notes (Signed)
Medical screening examination/treatment/procedure(s) were performed by non-physician practitioner and as supervising physician I was immediately available for consultation/collaboration.  Callaghan Laverdure R. Sheyann Sulton, MD 07/21/13 1017 

## 2013-09-04 ENCOUNTER — Emergency Department (HOSPITAL_COMMUNITY): Payer: BC Managed Care – PPO

## 2013-09-04 ENCOUNTER — Emergency Department (HOSPITAL_COMMUNITY)
Admission: EM | Admit: 2013-09-04 | Discharge: 2013-09-04 | Disposition: A | Discharge status: Home / Self Care | Payer: BC Managed Care – PPO | Attending: Emergency Medicine | Admitting: Emergency Medicine

## 2013-09-04 ENCOUNTER — Encounter (HOSPITAL_COMMUNITY): Payer: Self-pay | Admitting: Emergency Medicine

## 2013-09-04 DIAGNOSIS — Y939 Activity, unspecified: Secondary | ICD-10-CM | POA: Insufficient documentation

## 2013-09-04 DIAGNOSIS — J45909 Unspecified asthma, uncomplicated: Secondary | ICD-10-CM | POA: Insufficient documentation

## 2013-09-04 DIAGNOSIS — S92009A Unspecified fracture of unspecified calcaneus, initial encounter for closed fracture: Secondary | ICD-10-CM | POA: Insufficient documentation

## 2013-09-04 DIAGNOSIS — W108XXA Fall (on) (from) other stairs and steps, initial encounter: Secondary | ICD-10-CM | POA: Insufficient documentation

## 2013-09-04 DIAGNOSIS — W19XXXA Unspecified fall, initial encounter: Secondary | ICD-10-CM

## 2013-09-04 DIAGNOSIS — S0990XA Unspecified injury of head, initial encounter: Secondary | ICD-10-CM | POA: Insufficient documentation

## 2013-09-04 DIAGNOSIS — R269 Unspecified abnormalities of gait and mobility: Secondary | ICD-10-CM | POA: Insufficient documentation

## 2013-09-04 DIAGNOSIS — W1809XA Striking against other object with subsequent fall, initial encounter: Secondary | ICD-10-CM | POA: Insufficient documentation

## 2013-09-04 DIAGNOSIS — I10 Essential (primary) hypertension: Secondary | ICD-10-CM | POA: Insufficient documentation

## 2013-09-04 DIAGNOSIS — S298XXA Other specified injuries of thorax, initial encounter: Secondary | ICD-10-CM | POA: Insufficient documentation

## 2013-09-04 DIAGNOSIS — Y929 Unspecified place or not applicable: Secondary | ICD-10-CM | POA: Insufficient documentation

## 2013-09-04 DIAGNOSIS — S92001A Unspecified fracture of right calcaneus, initial encounter for closed fracture: Secondary | ICD-10-CM

## 2013-09-04 DIAGNOSIS — Z8659 Personal history of other mental and behavioral disorders: Secondary | ICD-10-CM | POA: Insufficient documentation

## 2013-09-04 HISTORY — DX: Unspecified asthma, uncomplicated: J45.909

## 2013-09-04 MED ORDER — HYDROCODONE-ACETAMINOPHEN 5-325 MG PO TABS: 2.0000 | ORAL_TABLET | Freq: Four times a day (QID) | ORAL | Status: DC | PRN

## 2013-09-04 MED ORDER — ONDANSETRON HCL 4 MG PO TABS: 4.0000 mg | ORAL_TABLET | Freq: Four times a day (QID) | ORAL | Status: DC

## 2013-09-04 NOTE — ED Provider Notes (Signed)
CSN: [id number]     Arrival date & time 09/04/13  1034 History   First MD Initiated Contact with Patient 09/04/13 1326     Chief Complaint  Patient presents with  . Fall   (Consider location/radiation/quality/duration/timing/severity/associated sxs/prior Treatment) HPI Comments: Patient is a 27 year old female who presents today after falling down 13 steps this morning. She reports that she tripped and hit her right ankle as well as her chest. Shortly after that she began to have sharp chest pain, worse with palpation. This has essentially resolved. She reports that she hit her head, but landed on her braids which provided her head with padding. She does not currently have a headache. She was initially lightheaded, but this has improved as well. She denies any loss of consciousness, disorientation. She has been ambulatory with antalgic gait to to her right ankle pain. The ankle pain is sharp and worse with ambulation and palpation. She denies fever, chills, nausea, vomiting, abdominal pain, numbness, weakness, paresthesias.  The history is provided by the patient. No language interpreter was used.    Past Medical History  Diagnosis Date  . Hypertension   . Anxiety   . Asthma    History reviewed. No pertinent past surgical history. No family history on file. History  Substance Use Topics  . Smoking status: Never Smoker   . Smokeless tobacco: Not on file  . Alcohol Use: No   OB History   Grav Para Term Preterm Abortions TAB SAB Ect Mult Living                 Review of Systems  Constitutional: Negative for fever and chills.  Respiratory: Negative for shortness of breath.   Cardiovascular: Positive for chest pain.  Gastrointestinal: Negative for nausea, vomiting and abdominal pain.  Musculoskeletal: Positive for arthralgias and gait problem.  Neurological: Positive for light-headedness. Negative for numbness.  All other systems reviewed and are negative.    Allergies  Review  of patient's allergies indicates no known allergies.  Home Medications   Current Outpatient Rx  Name  Route  Sig  Dispense  Refill  . aspirin-acetaminophen-caffeine (EXCEDRIN MIGRAINE) 250-250-65 MG per tablet   Oral   Take 2 tablets by mouth every 6 (six) hours as needed for headache.         . ibuprofen (ADVIL,MOTRIN) 200 MG tablet   Oral   Take 800 mg by mouth every 6 (six) hours as needed for cramping.         Marland Kitchen HYDROcodone-acetaminophen (NORCO/VICODIN) 5-325 MG per tablet   Oral   Take 2 tablets by mouth every 6 (six) hours as needed.   12 tablet   0   . ondansetron (ZOFRAN) 4 MG tablet   Oral   Take 1 tablet (4 mg total) by mouth every 6 (six) hours.   12 tablet   0    BP 133/77  Pulse 74  Temp(Src) 98.5 F (36.9 C) (Oral)  Resp 14  SpO2 99%  LMP 09/01/2013 Physical Exam  Nursing note and vitals reviewed. Constitutional: She is oriented to person, place, and time. She appears well-developed and well-nourished.  Non-toxic appearance. She does not have a sickly appearance. She does not appear ill. No distress.  HENT:  Head: Normocephalic and atraumatic.  Right Ear: External ear normal.  Left Ear: External ear normal.  Nose: Nose normal.  Mouth/Throat: Uvula is midline and oropharynx is clear and moist.  Eyes: Conjunctivae and EOM are normal. Pupils are equal, round, and  reactive to light.  Neck: Normal range of motion. No spinous process tenderness and no muscular tenderness present.  Cardiovascular: Normal rate, regular rhythm, normal heart sounds, intact distal pulses and normal pulses.   Pulses:      Dorsalis pedis pulses are 2+ on the right side, and 2+ on the left side.       Posterior tibial pulses are 2+ on the right side, and 2+ on the left side.  Pulmonary/Chest: Effort normal and breath sounds normal. No stridor. No respiratory distress. She has no wheezes. She has no rales.    Mild tenderness to chest wall, no bruising seen  Abdominal: Soft. She  exhibits no distension. There is no tenderness. There is no rigidity and no guarding.  Musculoskeletal: Normal range of motion.       Right ankle: Tenderness.       Feet:  compartment soft  Neurological: She is alert and oriented to person, place, and time. She has normal strength. No sensory deficit. Gait abnormal. Coordination normal.  Gait is antalgic, not ataxic Finger-nose-finger normal. Rapid alternating movements normal.   Skin: Skin is warm and dry. She is not diaphoretic. No erythema.  Psychiatric: She has a normal mood and affect. Her behavior is normal.    ED Course  Procedures (including critical care time) Labs Review Labs Reviewed - No data to display Imaging Review Dg Ankle Complete Right  09/04/2013   CLINICAL DATA:  Fall.  EXAM: RIGHT ANKLE - COMPLETE 3+ VIEW  COMPARISON:  None.  FINDINGS: Tiny bony density is noted along the lateral portion of the proximal tarsal region. This could represent a tiny avulsion fracture possibly arising from the calcaneus. No other abnormality identified. No prominent soft tissue swelling. The malleoli are intact.  IMPRESSION: Tiny bony density noted arising from the lateral aspect of the proximal tarsal region. This could represent a very tiny avulsion fracture, possibly from the calcaneus . The malleoli are intact.   Electronically Signed   By: Maisie Fus  Register   On: 09/04/2013 13:08    EKG Interpretation   None       MDM   1. Fall, initial encounter   2. Right calcaneal fracture, closed, initial encounter    Patient presents to emergency department after fall. X-ray of ankle shows a possible tiny avulsion fracture of the calcaneus. Neurovascularly intact, compartment soft. She was placed in a posterior splint and given crutches. She will follow up with orthopedics. Due to normal neuro exam and mechanism of injury I have no concern for intracranial pathology. No shortness of breath and chest pain has resolved. No further imaging is  required at this time. She was also given resource guide for primary care followup. Return instructions given. Vital signs stable for discharge. Discussed case with Dr. Juleen China who agrees with plan. Patient / Family / Caregiver informed of clinical course, understand medical decision-making process, and agree with plan.    Mora Bellman, PA-C 09/04/13 1556

## 2013-09-04 NOTE — ED Notes (Signed)
States that she fell down 13 steps this am and hit rt ankle and chest and head felt dizzy afterwards she states  Limped to get milk after

## 2013-09-04 NOTE — ED Notes (Signed)
Ice pack given for right ankle 

## 2013-09-05 NOTE — ED Provider Notes (Signed)
Medical screening examination/treatment/procedure(s) were performed by non-physician practitioner and as supervising physician I was immediately available for consultation/collaboration.  EKG Interpretation   None        Vernida Mcnicholas, MD 09/05/13 1439 

## 2014-02-07 ENCOUNTER — Emergency Department (HOSPITAL_COMMUNITY)
Admission: EM | Admit: 2014-02-07 | Discharge: 2014-02-07 | Disposition: A | Discharge status: Home / Self Care | Payer: BC Managed Care – PPO | Attending: Emergency Medicine | Admitting: Emergency Medicine

## 2014-02-07 ENCOUNTER — Encounter (HOSPITAL_COMMUNITY): Payer: Self-pay | Admitting: Emergency Medicine

## 2014-02-07 DIAGNOSIS — J45909 Unspecified asthma, uncomplicated: Secondary | ICD-10-CM | POA: Insufficient documentation

## 2014-02-07 DIAGNOSIS — J069 Acute upper respiratory infection, unspecified: Secondary | ICD-10-CM | POA: Insufficient documentation

## 2014-02-07 DIAGNOSIS — Z791 Long term (current) use of non-steroidal anti-inflammatories (NSAID): Secondary | ICD-10-CM | POA: Insufficient documentation

## 2014-02-07 DIAGNOSIS — Z79899 Other long term (current) drug therapy: Secondary | ICD-10-CM | POA: Insufficient documentation

## 2014-02-07 DIAGNOSIS — Z7982 Long term (current) use of aspirin: Secondary | ICD-10-CM | POA: Insufficient documentation

## 2014-02-07 DIAGNOSIS — I10 Essential (primary) hypertension: Secondary | ICD-10-CM | POA: Insufficient documentation

## 2014-02-07 DIAGNOSIS — Z8659 Personal history of other mental and behavioral disorders: Secondary | ICD-10-CM | POA: Insufficient documentation

## 2014-02-07 LAB — RAPID STREP SCREEN (MED CTR MEBANE ONLY): Streptococcus, Group A Screen (Direct): NEGATIVE

## 2014-02-07 MED ORDER — DM-GUAIFENESIN ER 30-600 MG PO TB12: 1.0000 | ORAL_TABLET | Freq: Two times a day (BID) | ORAL | Status: DC

## 2014-02-07 MED ORDER — IBUPROFEN 800 MG PO TABS: 800.0000 mg | ORAL_TABLET | Freq: Three times a day (TID) | ORAL | Status: DC

## 2014-02-07 MED ORDER — BENZONATATE 100 MG PO CAPS: 100.0000 mg | ORAL_CAPSULE | Freq: Three times a day (TID) | ORAL | Status: DC | PRN

## 2014-02-07 NOTE — ED Notes (Signed)
Patient states started having sore throat about 2 days ago.  Patient claims she is also having some ear pain.   Patient denies other symptoms.

## 2014-02-07 NOTE — Discharge Instructions (Signed)
Call for a follow up appointment with a Family or Primary Care Provider.  Return if Symptoms worsen.   Take medication as prescribed.  Drink plenty of fluids.   Emergency Department Resource Guide 1) Find a Doctor and Pay Out of Pocket Although you won't have to find out who is covered by your insurance plan, it is a good idea to ask around and get recommendations. You will then need to call the office and see if the doctor you have chosen will accept you as a new patient and what types of options they offer for patients who are self-pay. Some doctors offer discounts or will set up payment plans for their patients who do not have insurance, but you will need to ask so you aren't surprised when you get to your appointment.  2) Contact Your Local Health Department Not all health departments have doctors that can see patients for sick visits, but many do, so it is worth a call to see if yours does. If you don't know where your local health department is, you can check in your phone book. The CDC also has a tool to help you locate your state's health department, and many state websites also have listings of all of their local health departments.  3) Find a [facility] If your illness is not likely to be very severe or complicated, you may want to try a walk in clinic. These are popping up all over the country in pharmacies, drugstores, and shopping centers. They're usually staffed by nurse practitioners or physician assistants that have been trained to treat common illnesses and complaints. They're usually fairly quick and inexpensive. However, if you have serious medical issues or chronic medical problems, these are probably not your best option.  No Primary Care Doctor: - Call Health Connect at  [phone] - they can help you locate a primary care doctor that  accepts your insurance, provides certain services, etc. - Physician Referral Service- [phone]  Chronic Pain  Problems: Organization         Address  Phone   Notes  [facility]  [phone] Patients need to be referred by their primary care doctor.   Medication Assistance: Organization         Address  Phone   Notes  [facility] Medication [facility] [place], [place] [address] [phone] --Must be a resident of [facility] -- Must have NO insurance coverage whatsoever (no Medicaid/ Medicare, etc.) -- The pt. MUST have a primary care doctor that directs their care regularly and follows them in the community   Med[facility]ist  [phone]   [employer]  [phone]    Agencies that provide inexpensive medical care: Organization         Address  Phone   Notes  [place]  [phone]   [facility]    [phone]   [facility] [place], [place] [address] [phone]   [place] [address], [place] [phone]   Planned Parenthood    [phone]   [facility]    [phone]   Centre and [place] [address], [place] Phone:  [phone], Fax:  [phone] Hours of Operation:  9 am - 6 pm, M-F.  Also accepts Medicaid/Medicare and self-pay.  [facility] for [place] [address]  [place], [place], [place] Phone: [phone], Fax: [phone]. Hours of Operation:  8:30 am - 5:30 pm, M-F.  Also accepts Medicaid and self-pay.  [facility] [address], [place] Phone: [phone]   [place], [place], [place] [phone], Ext. [phone] Mondays & Thursdays: 7-9 AM.  First 15 patients are seen on a first come, first serve basis.    [place] Providers:  Organization         Address  Phone   Notes  [facility] [address], [place] [phone] Also  accepts self-pay patients.  [facility] [address], [place]  [phone]   [place], [address], [place] [phone]   [facility] [address] [place] [phone]   Lucianne Lei [address], [place]   [phone] Only accepts [place] Access [place] patients after they have their name applied to their card.   Self-Pay (no insurance) in [facility]:  Organization         Address  Phone   Notes  Sickle Cell Patients, [facility] [place] [phone]   [facility] [place] [phone]   [facility] [place]  [place], [place],  [phone]   [facility]/Dr. Osei-Bonsu  [address] [place] or [address], [place] [phone] Phone number for both [place] and [place] locations is the same.  Urgent Medical and [facility] [address], [place] [phone]   [facility] [address] [place] or [address] [phone]   [facility] [address], [place] [phone], phone; [phone], fax Sees patients 1st and 3rd Saturday of every month.  Must not qualify for public or private insurance (i.e. Medicaid, Medicare, [place] Health Choice, Veterans' Benefits)  Household income should be no more than 200% of the poverty level The clinic cannot treat you if you are pregnant or think you are pregnant  Sexually transmitted diseases are not treated at the clinic.    Dental Care: Organization         Address  Phone  Notes  [facility] Department of [facility] [place] [phone] Accepts children up to age 34 who are enrolled in [place] or [place]; pregnant  women with a Medicaid card; and children who have applied for Medicaid or [place] Health Choice, but were declined, whose parents can pay a reduced fee at time of service.  [facility] Department of [facility]  [address], [place] [phone] Accepts children up to age 24 who are enrolled in [place] or [place]; pregnant women with a Medicaid card; and children who have applied for Medicaid or [place] Health Choice, but were declined, whose parents can pay a reduced fee at time of service.  [facility] Adult Dental Access PROGRAM  [place] [phone] Patients are seen by appointment only. Walk-ins are not accepted. [place] will see patients 57 years of age and older. Monday - Tuesday (8am-5pm) Most Wednesdays (8:30-5pm) $30 per visit, cash only  [place]  [address], [place] [phone] Patients are seen by appointment only. Walk-ins are not accepted. [place] will see patients 47 years of age and older. One Wednesday Evening (Monthly: Volunteer Based).  $30 per visit, cash only  [place]  [phone] for adults; Children under age 20, call Graduate Pediatric Dentistry at [phone]. Children aged 75-14, please call [phone] to request a pediatric application.  Dental services are provided in all areas of dental care including fillings, crowns and bridges, complete and partial dentures, implants, gum treatment, root canals, and extractions. Preventive care is also provided. Treatment is provided to both adults and children. Patients are selected via a lottery and there is often a waiting list.   [facility] [address], [place]  [phone] [email or web address]   [facility] [address] [place], [place] [phone], Ext. [phone] Second and Fourth Thursday of each month, opens at 6:30 AM; Clinic ends at 9 AM.  Patients are  seen on a first-come first-served basis, and a limited number are seen during each clinic.   [facility]  [address] Hillard Danker [place], [place] [phone]   Eligibility Requirements You must have lived in [place], [place], or [place] counties for at least the last three months.   You cannot be eligible for state or federal sponsored [employer], including [employer], [place], or [employer].   You generally cannot be eligible for healthcare insurance through your employer.    How to apply: Eligibility screenings are held every Tuesday and Wednesday afternoon from 1:00 pm until 4:00 pm. You do not need an appointment for the interview!  [facility] [address] [place], [place]   [place]  [place] Department  [place]  [phone]    Behavioral Health Resources in the Community: Intensive Outpatient Programs Organization         Address  Phone  Notes  [place]. [address] [place], [place] [phone]   [facility] [address] [place], [place]   ADS: Alcohol & Drug S[facility] [address] [place], [place]   [place] [address]  [place], [place] or [phone]   Substance Abuse Resources Organization         Address  Phone  Notes  Alcohol and Drug Services  [phone]   [place]  [phone]   [facility] [place]   Chinita Pester  [phone]   Residential & Outpatient Substance Abuse Program  [phone]   Psychological Services Organization         Address  Phone  Notes  [facility] [place]  [place]  [phone]   [place] [address] [place] or [phone]    Mobile Crisis  Teams Organization         Address  Phone  Notes  [facility], Mobile Crisis Care Unit  [phone]   [facility] Psych[facility]  [address] [place], [place]   [place] [address] [place] [phone]    Self-Help/Support Groups Organization         Address  Phone             Notes  [place]. of [place] -  variety of support groups  [phone]- [phone] Call for more information  Narcotics Anonymous (NA), [facility] [address], [employer] [place]  2 meetings at this location   Residential Facilities manager         Address  Phone  Notes  [facility] [place],    [place]  [phone]   [facility]  [address] [place] 623762, [place], [place]   [place], [place] [phone] Admissions: 8am-3pm M-F  Incentives Substance [place] [address],    [place], [place] [phone]   [facility] [address] [place], [place], [place]   [facility] [address]  [place], [place]   Insight Programs - Intensive Outpatient [address] Kristeen [address], [place], [place]   [facility] ([place] [address].,  [place], [place] [phone] or [phone]   [facility]idential Treatment Services (RTS) [address] [place], [place] Accepts Medicaid  [facility] [place] [address],  [place] [phone] Substance Abuse/Addiction Treatment   [facility] Organization         Address  Phone  Notes  [facility]  [phone]   Domenic Schwab, PhD [address] [place], [place]   [phone] or [phone]   [place], [place] [phone]   [facility] [address] [place], [place] [phone]  Insurance/Medicaid/sponsorship through [facility] and Families [address]                                    [place], [place] [phone] [place] [address], [place] [phone]    Dr. Adele Schilder  [phone]   [facility] Dept. 1) [address] [place] 2) [place] 3)  [place] [address], [place] [phone]  [phone]   [place] [phone] or [phone] (After Hours)

## 2014-02-07 NOTE — ED Provider Notes (Signed)
CSN: [id number]     Arrival date & time 02/07/14  7893 History  This chart was scribed for non-physician practitioner Harvie Heck working with Jasper Riling. Alvino Chapel, MD by Donato Schultz, ED Scribe. This patient was seen in room TR07C/TR07C and the patient's care was started at 9:55 AM.      Chief Complaint  Patient presents with  . Sore Throat   HPI Comments: Kim Williams is a 28 y.o. female who presents to the Emergency Department complaining of constant sore throat that started a couple of days ago.  The patient lists cough, congestion, and fever as associated symptoms.  She states that her fever was 102 degrees last night.  She states that she took Advil last night which helped break her fever, no antipyretic today.  The patient denies sick contacts.  She denies being allergic to any medication.    Patient is a 28 y.o. female presenting with pharyngitis. The history is provided by the patient. No language interpreter was used.  Sore Throat  Sore Throat Associated symptoms include congestion, coughing, a fever and a sore throat. Pertinent negatives include no chills or diaphoresis.    Past Medical History  Diagnosis Date  . Hypertension   . Anxiety   . Asthma    No past surgical history on file. No family history on file. History  Substance Use Topics  . Smoking status: Never Smoker   . Smokeless tobacco: Not on file  . Alcohol Use: No   OB History   Grav Para Term Preterm Abortions TAB SAB Ect Mult Living                 Review of Systems  Constitutional: Positive for fever. Negative for chills and diaphoresis.  HENT: Positive for congestion and sore throat.   Respiratory: Positive for cough. Negative for wheezing.   All other systems reviewed and are negative.     Allergies  Review of patient's allergies indicates no known allergies.  Home Medications   Prior to Admission medications   Medication Sig Start Date End Date Taking? Authorizing Provider   aspirin-acetaminophen-caffeine (EXCEDRIN MIGRAINE) [id number] MG per tablet Take 2 tablets by mouth every 6 (six) hours as needed for headache.    Historical Provider, MD  HYDROcodone-acetaminophen (NORCO/VICODIN) 5-325 MG per tablet Take 2 tablets by mouth every 6 (six) hours as needed. 09/04/13   Elwyn Lade, PA-C  ibuprofen (ADVIL,MOTRIN) 200 MG tablet Take 800 mg by mouth every 6 (six) hours as needed for cramping.    Historical Provider, MD  ondansetron (ZOFRAN) 4 MG tablet Take 1 tablet (4 mg total) by mouth every 6 (six) hours. 09/04/13   Elwyn Lade, PA-C   Triage Vitals: BP 135/81  Pulse 85  Temp(Src) 98.8 F (37.1 C) (Oral)  Resp 18  Ht 5\' 4"  (1.626 m)  Wt 187 lb (84.823 kg)  BMI 32.08 kg/m2  SpO2 100%  Physical Exam  Nursing note and vitals reviewed. Constitutional: She is oriented to person, place, and time. Vital signs are normal. She appears well-developed and well-nourished.  Non-toxic appearance. She does not have a sickly appearance. She does not appear ill. No distress.  HENT:  Head: Normocephalic and atraumatic.  Right Ear: Hearing, tympanic membrane, external ear and ear canal normal. No middle ear effusion.  Left Ear: Hearing, tympanic membrane, external ear and ear canal normal.  No middle ear effusion.  Nose: Rhinorrhea present.  Mouth/Throat: Uvula is midline and mucous membranes are normal. No  trismus in the jaw. Oropharyngeal exudate and posterior oropharyngeal erythema present.  Tonsils 2+ bilaterally.   Eyes: EOM are normal.  Neck: Normal range of motion. Neck supple.  Pulmonary/Chest: Effort normal. No respiratory distress.  Musculoskeletal: Normal range of motion.  Lymphadenopathy:       Head (right side): Tonsillar adenopathy present. No submental, no submandibular, no preauricular, no posterior auricular and no occipital adenopathy present.       Head (left side): Tonsillar adenopathy present. No submental, no submandibular, no preauricular,  no posterior auricular and no occipital adenopathy present.    She has no cervical adenopathy.  Neurological: She is alert and oriented to person, place, and time.  Skin: Skin is warm and dry. She is not diaphoretic.  Psychiatric: She has a normal mood and affect. Her behavior is normal.    ED Course  Procedures (including critical care time)  COORDINATION OF CARE: 10:09 AM- Discussed a clinical suspicion of a viral or bacterial infection.  Discussed waiting for the patient's rapid strep screen to return before determining a treatment plan.  The patient agreed to the treatment plan.   Labs Review Labs Reviewed  RAPID STREP SCREEN     MDM   Final diagnoses:  Upper respiratory infection    Patients symptoms are consistent with URI, likely viral etiology. Negative rapid strep. Discussed that antibiotics are not indicated for viral infections. Pt will be discharged with symptomatic treatment.  Verbalizes understanding and is agreeable with plan. Pt is hemodynamically stable & in NAD prior to dc.  Meds given in ED:  Medications - No data to display  Discharge Medication List as of 02/07/2014 10:50 AM    START taking these medications   Details  benzonatate (TESSALON PERLES) 100 MG capsule Take 1 capsule (100 mg total) by mouth 3 (three) times daily as needed for cough., Starting 02/07/2014, Until Discontinued, Print    dextromethorphan-guaiFENesin (MUCINEX DM) 30-600 MG per 12 hr tablet Take 1 tablet by mouth 2 (two) times daily., Starting 02/07/2014, Until Discontinued, Print    !! ibuprofen (ADVIL,MOTRIN) 800 MG tablet Take 1 tablet (800 mg total) by mouth 3 (three) times daily. Take with food, Starting 02/07/2014, Until Discontinued, Print     !! - Potential duplicate medications found. Please discuss with provider.      I personally performed the services described in this documentation, which was scribed in my presence. The recorded information has been reviewed and is  accurate.    Lorrine Kin, PA-C 02/07/14 [phone]

## 2014-02-09 ENCOUNTER — Emergency Department (HOSPITAL_COMMUNITY)
Admission: EM | Admit: 2014-02-09 | Discharge: 2014-02-09 | Disposition: A | Discharge status: Home / Self Care | Payer: BC Managed Care – PPO | Attending: Emergency Medicine | Admitting: Emergency Medicine

## 2014-02-09 ENCOUNTER — Encounter (HOSPITAL_COMMUNITY): Payer: Self-pay | Admitting: Emergency Medicine

## 2014-02-09 DIAGNOSIS — Z79899 Other long term (current) drug therapy: Secondary | ICD-10-CM | POA: Insufficient documentation

## 2014-02-09 DIAGNOSIS — I10 Essential (primary) hypertension: Secondary | ICD-10-CM | POA: Insufficient documentation

## 2014-02-09 DIAGNOSIS — Z8659 Personal history of other mental and behavioral disorders: Secondary | ICD-10-CM | POA: Insufficient documentation

## 2014-02-09 DIAGNOSIS — Z791 Long term (current) use of non-steroidal anti-inflammatories (NSAID): Secondary | ICD-10-CM | POA: Insufficient documentation

## 2014-02-09 DIAGNOSIS — R1012 Left upper quadrant pain: Secondary | ICD-10-CM | POA: Insufficient documentation

## 2014-02-09 DIAGNOSIS — J039 Acute tonsillitis, unspecified: Secondary | ICD-10-CM

## 2014-02-09 DIAGNOSIS — J45909 Unspecified asthma, uncomplicated: Secondary | ICD-10-CM | POA: Insufficient documentation

## 2014-02-09 DIAGNOSIS — F17200 Nicotine dependence, unspecified, uncomplicated: Secondary | ICD-10-CM | POA: Insufficient documentation

## 2014-02-09 LAB — CULTURE, GROUP A STREP

## 2014-02-09 LAB — MONONUCLEOSIS SCREEN: MONO SCREEN: NEGATIVE

## 2014-02-09 MED ORDER — HYDROCODONE-ACETAMINOPHEN 7.5-325 MG/15ML PO SOLN
5.0000 mL | Freq: Once | ORAL | Status: AC
  Administered 2014-02-09: 5 mL via ORAL
  Filled 2014-02-09: qty 15

## 2014-02-09 MED ORDER — PENICILLIN G BENZATHINE 1200000 UNIT/2ML IM SUSP
1.2000 10*6.[IU] | Freq: Once | INTRAMUSCULAR | Status: AC
  Administered 2014-02-09: 1.2 10*6.[IU] via INTRAMUSCULAR
  Filled 2014-02-09: qty 2

## 2014-02-09 MED ORDER — KETOROLAC TROMETHAMINE 60 MG/2ML IM SOLN
60.0000 mg | Freq: Once | INTRAMUSCULAR | Status: AC
  Administered 2014-02-09: 60 mg via INTRAMUSCULAR
  Filled 2014-02-09: qty 2

## 2014-02-09 MED ORDER — IBUPROFEN 800 MG PO TABS: 800.0000 mg | ORAL_TABLET | Freq: Three times a day (TID) | ORAL | Status: DC

## 2014-02-09 NOTE — ED Notes (Signed)
Pt states shes had a sore throat, her rapid strep was negative here. She continues to feel her lymph nodes are swollen and noticed today there was blood in her spit

## 2014-02-09 NOTE — ED Provider Notes (Signed)
Medical screening examination/treatment/procedure(s) were performed by non-physician practitioner and as supervising physician I was immediately available for consultation/collaboration.   EKG Interpretation None       Kim Williams. Alvino Chapel, MD 02/09/14 [phone]

## 2014-02-09 NOTE — Discharge Instructions (Signed)
Salt Water Gargle This solution will help make your mouth and throat feel better. HOME CARE INSTRUCTIONS   Mix 1 teaspoon of salt in 8 ounces of warm water.  Gargle with this solution as much or often as you need or as directed. Swish and gargle gently if you have any sores or wounds in your mouth.  Do not swallow this mixture. Document Released: 07/02/2004 Document Revised: 12/21/2011 Document Reviewed: 11/23/2008 [facility] Patient Information 2014 [place]. Tonsillitis Tonsillitis is an infection of the throat that causes the tonsils to become red, tender, and swollen. Tonsils are collections of lymphoid tissue at the back of the throat. Each tonsil has crevices (crypts). Tonsils help fight nose and throat infections and keep infection from spreading to other parts of the body for the first 18 months of life.  CAUSES Sudden (acute) tonsillitis is usually caused by infection with streptococcal bacteria. Long-lasting (chronic) tonsillitis occurs when the crypts of the tonsils become filled with pieces of food and bacteria, which makes it easy for the tonsils to become repeatedly infected. SYMPTOMS  Symptoms of tonsillitis include:  A sore throat, with possible difficulty swallowing.  White patches on the tonsils.  Fever.  Tiredness.  New episodes of snoring during sleep, when you did not snore before.  Small, foul-smelling, yellowish-white pieces of material (tonsilloliths) that you occasionally cough up or spit out. The tonsilloliths can also cause you to have bad breath. DIAGNOSIS Tonsillitis can be diagnosed through a physical exam. Diagnosis can be confirmed with the results of lab tests, including a throat culture. TREATMENT  The goals of tonsillitis treatment include the reduction of the severity and duration of symptoms and prevention of associated conditions. Symptoms of tonsillitis can be improved with the use of steroids to reduce the swelling. Tonsillitis caused by  bacteria can be treated with antibiotics. Usually, treatment with antibiotics is started before the cause of the tonsillitis is known. However, if it is determined that the cause is not bacterial, antibiotics will not treat the tonsillitis. If attacks of tonsillitis are severe and frequent, your caregiver may recommend surgery to remove the tonsils (tonsillectomy). HOME CARE INSTRUCTIONS   Rest as much as possible and get plenty of sleep.  Drink plenty of fluids. While the throat is very sore, eat soft foods or liquids, such as sherbet, soups, or instant breakfast drinks.  Eat frozen ice pops.  Gargle with a warm or cold liquid to help soothe the throat. Mix 1/4 teaspoon of salt and 1/4 teaspoon of baking soda in in 8 oz of water. SEEK MEDICAL CARE IF:   Large, tender lumps develop in your neck.  A rash develops.  A green, yellow-brown, or bloody substance is coughed up.  You are unable to swallow liquids or food for 24 hours.  You notice that only one of the tonsils is swollen. SEEK IMMEDIATE MEDICAL CARE IF:   You develop any new symptoms such as vomiting, severe headache, stiff neck, chest pain, or trouble breathing or swallowing.  You have severe throat pain along with drooling or voice changes.  You have severe pain, unrelieved with recommended medications.  You are unable to fully open the mouth.  You develop redness, swelling, or severe pain anywhere in the neck.  You have a fever. MAKE SURE YOU:   Understand these instructions.  Will watch your condition.  Will get help right away if you are not doing well or get worse. Document Released: 07/08/2005 Document Revised: 05/31/2013 Document Reviewed: 03/17/2013 ExitCare Patient  Information ©2014 [employer]. ° °

## 2014-02-09 NOTE — ED Provider Notes (Signed)
CSN: [id number]     Arrival date & time 02/09/14  1502 History   First MD Initiated Contact with Patient 02/09/14 1545     Chief Complaint  Patient presents with  . Lymphadenopathy     (Consider location/radiation/quality/duration/timing/severity/associated sxs/prior Treatment) Patient is a 28 y.o. female presenting with pharyngitis. The history is provided by the patient. No language interpreter was used.  Sore Throat This is a new problem. The current episode started 1 to 4 weeks ago. The problem occurs constantly. The problem has been gradually worsening. Associated symptoms include headaches and a sore throat. Pertinent negatives include no abdominal pain, chills, congestion, coughing, fever, nausea, rash or vomiting. Associated symptoms comments: Sore throat, painful swallowing for the past week, left greater than right. She denies fever. There has been some nausea without vomiting. She feels her glands are swollen. No relief with gargling, ibuprofen and pushing fluids..    Past Medical History  Diagnosis Date  . Hypertension   . Anxiety   . Asthma    History reviewed. No pertinent past surgical history. History reviewed. No pertinent family history. History  Substance Use Topics  . Smoking status: Current Some Day Smoker    Types: Cigarettes  . Smokeless tobacco: Not on file  . Alcohol Use: Yes   OB History   Grav Para Term Preterm Abortions TAB SAB Ect Mult Living                 Review of Systems  Constitutional: Negative for fever and chills.  HENT: Positive for sore throat and trouble swallowing. Negative for congestion.   Respiratory: Negative.  Negative for cough and shortness of breath.   Cardiovascular: Negative.   Gastrointestinal: Negative.  Negative for nausea, vomiting and abdominal pain.  Musculoskeletal: Negative.   Skin: Negative.  Negative for rash.  Neurological: Positive for headaches.      Allergies  Review of patient's allergies indicates no  known allergies.  Home Medications   Prior to Admission medications   Medication Sig Start Date End Date Taking? Authorizing Provider  dextromethorphan-guaiFENesin (MUCINEX DM) 30-600 MG per 12 hr tablet Take 1 tablet by mouth 2 (two) times daily. 02/07/14  Yes Lauren Burnetta Sabin, PA-C  ibuprofen (ADVIL,MOTRIN) 200 MG tablet Take 800 mg by mouth every 6 (six) hours as needed for cramping.   Yes Historical Provider, MD  benzonatate (TESSALON PERLES) 100 MG capsule Take 1 capsule (100 mg total) by mouth 3 (three) times daily as needed for cough. 02/07/14   Lauren Burnetta Sabin, PA-C  ibuprofen (ADVIL,MOTRIN) 800 MG tablet Take 1 tablet (800 mg total) by mouth 3 (three) times daily. Take with food 02/07/14   Lorrine Kin, PA-C   BP 117/80  Pulse 67  Temp(Src) 98.4 F (36.9 C) (Oral)  Resp 16  Ht 5\' 4"  (1.626 m)  Wt 187 lb (84.823 kg)  BMI 32.08 kg/m2  SpO2 98%  LMP 01/24/2014 Physical Exam  Constitutional: She is oriented to person, place, and time. She appears well-developed and well-nourished.  HENT:  Head: Normocephalic.  Mouth/Throat: Oropharynx is clear and moist.  Left greater than right tonsillar swelling with exudate. No visualized peritonsillar abscess formation. Uvula midline.  Neck: Normal range of motion. Neck supple.  Cardiovascular: Normal rate and regular rhythm.   Pulmonary/Chest: Effort normal and breath sounds normal.  Abdominal: Soft. Bowel sounds are normal. There is tenderness. There is no rebound and no guarding.  LUQ abdominal tenderness.   Musculoskeletal: Normal range of motion.  Lymphadenopathy:       Head (right side): Occipital adenopathy present.       Head (left side): Occipital adenopathy present.    She has cervical adenopathy.  Neurological: She is alert and oriented to person, place, and time.  Skin: Skin is warm and dry. No rash noted.  Psychiatric: She has a normal mood and affect.    ED Course  Procedures (including critical care time) Labs  Review Labs Reviewed  MONONUCLEOSIS SCREEN    Imaging Review No results found.   EKG Interpretation None      MDM   Final diagnoses:  None    1. Exudative tonsillitis  Mono spot done secondary to LUQ tenderness and posterior lymph nodal swelling, which is negative. Given patient duration of symptoms and exam presentation, bicillin given. No pain relief with Hycet. Will recommend ibuprofen, continuation of gargles. Return precautions given.    Dewaine Oats, PA-C 02/09/14 6045

## 2014-02-09 NOTE — ED Provider Notes (Signed)
Medical screening examination/treatment/procedure(s) were performed by non-physician practitioner and as supervising physician I was immediately available for consultation/collaboration.     Veryl Speak, MD 02/09/14 2322

## 2014-02-09 NOTE — ED Notes (Signed)
Introduced self to pt.  Is aware she will be receiving 2 injections IM then will be discharged.  Pt anxious to "get this over with"

## 2014-02-13 ENCOUNTER — Encounter (HOSPITAL_COMMUNITY): Payer: Self-pay | Admitting: Emergency Medicine

## 2014-02-13 ENCOUNTER — Emergency Department (HOSPITAL_COMMUNITY)
Admission: EM | Admit: 2014-02-13 | Discharge: 2014-02-13 | Discharge status: Against Medical Advice | Payer: BC Managed Care – PPO | Attending: Emergency Medicine | Admitting: Emergency Medicine

## 2014-02-13 DIAGNOSIS — K625 Hemorrhage of anus and rectum: Secondary | ICD-10-CM | POA: Insufficient documentation

## 2014-02-13 DIAGNOSIS — J45909 Unspecified asthma, uncomplicated: Secondary | ICD-10-CM | POA: Insufficient documentation

## 2014-02-13 DIAGNOSIS — K5900 Constipation, unspecified: Secondary | ICD-10-CM | POA: Insufficient documentation

## 2014-02-13 DIAGNOSIS — K6289 Other specified diseases of anus and rectum: Secondary | ICD-10-CM | POA: Insufficient documentation

## 2014-02-13 DIAGNOSIS — F17200 Nicotine dependence, unspecified, uncomplicated: Secondary | ICD-10-CM | POA: Insufficient documentation

## 2014-02-13 DIAGNOSIS — K649 Unspecified hemorrhoids: Secondary | ICD-10-CM | POA: Insufficient documentation

## 2014-02-13 DIAGNOSIS — I10 Essential (primary) hypertension: Secondary | ICD-10-CM | POA: Insufficient documentation

## 2014-02-13 NOTE — ED Notes (Signed)
Pt called for re assessment no answer.

## 2014-02-13 NOTE — ED Notes (Signed)
Name called no anwser

## 2014-02-13 NOTE — ED Notes (Addendum)
Pt reports rectal itching and pain, having blood on stool, constipated. Pt reports having hemorrhoids. No acute distress noted at triage.

## 2014-08-06 ENCOUNTER — Emergency Department (HOSPITAL_COMMUNITY)
Admission: EM | Admit: 2014-08-06 | Discharge: 2014-08-06 | Disposition: A | Discharge status: Home / Self Care | Payer: Self-pay | Attending: Emergency Medicine | Admitting: Emergency Medicine

## 2014-08-06 ENCOUNTER — Encounter (HOSPITAL_COMMUNITY): Payer: Self-pay | Admitting: Emergency Medicine

## 2014-08-06 ENCOUNTER — Emergency Department (HOSPITAL_COMMUNITY): Payer: BC Managed Care – PPO

## 2014-08-06 DIAGNOSIS — Z720 Tobacco use: Secondary | ICD-10-CM | POA: Insufficient documentation

## 2014-08-06 DIAGNOSIS — N921 Excessive and frequent menstruation with irregular cycle: Secondary | ICD-10-CM

## 2014-08-06 DIAGNOSIS — R1031 Right lower quadrant pain: Secondary | ICD-10-CM

## 2014-08-06 DIAGNOSIS — Z3202 Encounter for pregnancy test, result negative: Secondary | ICD-10-CM | POA: Insufficient documentation

## 2014-08-06 DIAGNOSIS — D250 Submucous leiomyoma of uterus: Secondary | ICD-10-CM | POA: Insufficient documentation

## 2014-08-06 DIAGNOSIS — N924 Excessive bleeding in the premenopausal period: Secondary | ICD-10-CM | POA: Insufficient documentation

## 2014-08-06 DIAGNOSIS — J45909 Unspecified asthma, uncomplicated: Secondary | ICD-10-CM | POA: Insufficient documentation

## 2014-08-06 DIAGNOSIS — I10 Essential (primary) hypertension: Secondary | ICD-10-CM | POA: Insufficient documentation

## 2014-08-06 DIAGNOSIS — Z8659 Personal history of other mental and behavioral disorders: Secondary | ICD-10-CM | POA: Insufficient documentation

## 2014-08-06 HISTORY — DX: Benign neoplasm of connective and other soft tissue, unspecified: D21.9

## 2014-08-06 HISTORY — DX: Developmental ovarian cyst: Q50.1

## 2014-08-06 LAB — COMPREHENSIVE METABOLIC PANEL
ALBUMIN: 3.9 g/dL (ref 3.5–5.2)
ALT: 14 U/L (ref 0–35)
AST: 19 U/L (ref 0–37)
Alkaline Phosphatase: 66 U/L (ref 39–117)
Anion gap: 14 (ref 5–15)
BUN: 11 mg/dL (ref 6–23)
CALCIUM: 9.6 mg/dL (ref 8.4–10.5)
CO2: 24 mEq/L (ref 19–32)
CREATININE: 1.09 mg/dL (ref 0.50–1.10)
Chloride: 104 mEq/L (ref 96–112)
GFR calc Af Amer: 80 mL/min — ABNORMAL LOW (ref >90)
GFR, EST NON AFRICAN AMERICAN: 69 mL/min — AB (ref >90)
Glucose, Bld: 92 mg/dL (ref 70–99)
Potassium: 4.2 mEq/L (ref 3.7–5.3)
Sodium: 142 mEq/L (ref 137–147)
TOTAL PROTEIN: 7.9 g/dL (ref 6.0–8.3)
Total Bilirubin: 0.4 mg/dL (ref 0.3–1.2)

## 2014-08-06 LAB — CBC WITH DIFFERENTIAL/PLATELET
BASOS PCT: 0 % (ref 0–1)
Basophils Absolute: 0 10*3/uL (ref 0.0–0.1)
Eosinophils Absolute: 0.2 10*3/uL (ref 0.0–0.7)
Eosinophils Relative: 4 % (ref 0–5)
HEMATOCRIT: 41.2 % (ref 36.0–46.0)
HEMOGLOBIN: 13.7 g/dL (ref 12.0–15.0)
LYMPHS ABS: 2.1 10*3/uL (ref 0.7–4.0)
LYMPHS PCT: 37 % (ref 12–46)
MCH: 26.8 pg (ref 26.0–34.0)
MCHC: 33.3 g/dL (ref 30.0–36.0)
MCV: 80.5 fL (ref 78.0–100.0)
MONO ABS: 0.5 10*3/uL (ref 0.1–1.0)
MONOS PCT: 9 % (ref 3–12)
NEUTROS ABS: 2.9 10*3/uL (ref 1.7–7.7)
Neutrophils Relative %: 50 % (ref 43–77)
Platelets: 252 10*3/uL (ref 150–400)
RBC: 5.12 MIL/uL — ABNORMAL HIGH (ref 3.87–5.11)
RDW: 14 % (ref 11.5–15.5)
WBC: 5.8 10*3/uL (ref 4.0–10.5)

## 2014-08-06 LAB — URINALYSIS, ROUTINE W REFLEX MICROSCOPIC
Bilirubin Urine: NEGATIVE
Glucose, UA: NEGATIVE mg/dL
Ketones, ur: NEGATIVE mg/dL
LEUKOCYTES UA: NEGATIVE
Nitrite: NEGATIVE
PH: 5 (ref 5.0–8.0)
PROTEIN: NEGATIVE mg/dL
SPECIFIC GRAVITY, URINE: 1.015 (ref 1.005–1.030)
Urobilinogen, UA: 0.2 mg/dL (ref 0.0–1.0)

## 2014-08-06 LAB — WET PREP, GENITAL
Trich, Wet Prep: NONE SEEN
WBC WET PREP: NONE SEEN
YEAST WET PREP: NONE SEEN

## 2014-08-06 LAB — URINE MICROSCOPIC-ADD ON

## 2014-08-06 LAB — POC URINE PREG, ED: Preg Test, Ur: NEGATIVE

## 2014-08-06 MED ORDER — OXYCODONE-ACETAMINOPHEN 5-325 MG PO TABS: 1.0000 | ORAL_TABLET | ORAL | Status: AC | PRN

## 2014-08-06 MED ORDER — OXYCODONE-ACETAMINOPHEN 5-325 MG PO TABS
1.0000 | ORAL_TABLET | Freq: Once | ORAL | Status: AC
  Administered 2014-08-06: 1 via ORAL
  Filled 2014-08-06: qty 1

## 2014-08-06 MED ORDER — IOHEXOL 300 MG/ML  SOLN
25.0000 mL | INTRAMUSCULAR | Status: DC
  Administered 2014-08-06: 25 mL via ORAL

## 2014-08-06 MED ORDER — IOHEXOL 300 MG/ML  SOLN
100.0000 mL | Freq: Once | INTRAMUSCULAR | Status: AC | PRN
  Administered 2014-08-06: 100 mL via INTRAVENOUS

## 2014-08-06 MED ORDER — ONDANSETRON HCL 4 MG/2ML IJ SOLN
INTRAMUSCULAR | Status: AC
  Filled 2014-08-06: qty 2

## 2014-08-06 MED ORDER — IBUPROFEN 800 MG PO TABS: 800.0000 mg | ORAL_TABLET | Freq: Three times a day (TID) | ORAL | Status: AC

## 2014-08-06 MED ORDER — SODIUM CHLORIDE 0.9 % IV BOLUS (SEPSIS)
1000.0000 mL | Freq: Once | INTRAVENOUS | Status: AC
  Administered 2014-08-06: 1000 mL via INTRAVENOUS

## 2014-08-06 MED ORDER — KETOROLAC TROMETHAMINE 30 MG/ML IJ SOLN
30.0000 mg | Freq: Once | INTRAMUSCULAR | Status: AC
  Administered 2014-08-06: 30 mg via INTRAVENOUS
  Filled 2014-08-06: qty 1

## 2014-08-06 MED ORDER — PROMETHAZINE HCL 25 MG/ML IJ SOLN
25.0000 mg | Freq: Once | INTRAMUSCULAR | Status: DC
  Filled 2014-08-06: qty 1

## 2014-08-06 MED ORDER — ONDANSETRON HCL 4 MG/2ML IJ SOLN
4.0000 mg | Freq: Once | INTRAMUSCULAR | Status: AC
  Administered 2014-08-06: 4 mg via INTRAVENOUS

## 2014-08-06 MED ORDER — MEGESTROL ACETATE 40 MG PO TABS: 40.0000 mg | ORAL_TABLET | Freq: Two times a day (BID) | ORAL | Status: AC

## 2014-08-06 MED ORDER — MEGESTROL ACETATE 40 MG PO TABS
40.0000 mg | ORAL_TABLET | Freq: Every day | ORAL | Status: DC
  Filled 2014-08-06: qty 1

## 2014-08-06 MED ORDER — MORPHINE SULFATE 4 MG/ML IJ SOLN
4.0000 mg | Freq: Once | INTRAMUSCULAR | Status: AC
  Administered 2014-08-06: 4 mg via INTRAVENOUS
  Filled 2014-08-06: qty 1

## 2014-08-06 MED ORDER — MEGESTROL ACETATE 40 MG PO TABS
40.0000 mg | ORAL_TABLET | Freq: Every day | ORAL | Status: DC
  Administered 2014-08-06: 40 mg via ORAL

## 2014-08-06 NOTE — ED Provider Notes (Signed)
CSN: [id number]     Arrival date & time 08/06/14  1401 History   First MD Initiated Contact with Patient 08/06/14 1745     Chief Complaint  Patient presents with  . Vaginal Bleeding  . Abdominal Pain     (Consider location/radiation/quality/duration/timing/severity/associated sxs/prior Treatment) HPI Comments: The patient is a 28 year old female presenting to the department with a chief complaint of abnormal vaginal discharge since March 2 015. Patient reports having approximately 2 menses each month since. She reports last menses 07/14/2014, she reports stopping vaginal bleeding, bleeding resumed 10 14-16, and then started again on the 17th with persistent bleeding through today. 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 Patient also reports suprapubic and Right sided abdominal discomfort starting today. Denies and rheumatic discharge, fever, chills, dysuria, nausea, vomiting, diarrhea.   The history is provided by the patient. No language interpreter was used.    Past Medical History  Diagnosis Date  . Anxiety   . Asthma   . Hypertension   . Fibroids   . Multiple developmental ovarian cysts    History reviewed. No pertinent past surgical history. No family history on file. History  Substance Use Topics  . Smoking status: Current Some Day Smoker    Types: Cigarettes  . Smokeless tobacco: Not on file  . Alcohol Use: Yes   OB History   Grav Para Term Preterm Abortions TAB SAB Ect Mult Living                 Review of Systems  Constitutional: Negative for fever and chills.  Gastrointestinal: Negative for nausea, vomiting, diarrhea and constipation.  Genitourinary: Positive for vaginal bleeding and pelvic pain. Negative for dysuria and hematuria.      Allergies  Review of patient's allergies indicates no known allergies.  Home Medications   Prior to Admission medications   Not on File   BP 128/91  Pulse 55  Temp(Src) 98.5 F (36.9 C) (Oral)  Resp 15  Ht 5'  4" (1.626 m)  Wt 184 lb (83.462 kg)  BMI 31.57 kg/m2  SpO2 99%  LMP 08/06/2014 Physical Exam  Nursing note and vitals reviewed. Constitutional: She is oriented to person, place, and time. She appears well-developed and well-nourished. No distress.  HENT:  Head: Normocephalic and atraumatic.  Eyes: EOM are normal. Pupils are equal, round, and reactive to light.  Neck: Neck supple.  Cardiovascular: Normal rate and regular rhythm.   Pulmonary/Chest: Effort normal and breath sounds normal. She has no wheezes. She has no rales.  Abdominal: Soft. She exhibits no mass. There is tenderness in the right lower quadrant and suprapubic area. There is no rebound and no guarding.  Obese abdomen.  Genitourinary: Cervix exhibits no discharge and no friability. Right adnexum displays no mass. Left adnexum displays no mass.  Moderate amount of dark blood in posterior vaginal vault with minimal clotting, patient had mild right-sided uterine tenderness with bimanual exam.  Neurological: She is alert and oriented to person, place, and time.  Skin: Skin is warm and dry.  Psychiatric: She has a normal mood and affect. Her behavior is normal.    ED Course  Procedures (including critical care time) Labs Review Labs Reviewed  WET PREP, GENITAL - Abnormal; Notable for the following:    Clue Cells Wet Prep HPF POC RARE (*)    All other components within normal limits  CBC WITH DIFFERENTIAL - Abnormal; Notable for the following:    RBC 5.12 (*)  All other components within normal limits  COMPREHENSIVE METABOLIC PANEL - Abnormal; Notable for the following:    GFR calc non Af Amer 69 (*)    GFR calc Af Amer 80 (*)    All other components within normal limits  URINALYSIS, ROUTINE W REFLEX MICROSCOPIC - Abnormal; Notable for the following:    Hgb urine dipstick LARGE (*)    All other components within normal limits  GC/CHLAMYDIA PROBE AMP  URINE MICROSCOPIC-ADD ON  POC URINE PREG, ED    Imaging  Review Ct Abdomen Pelvis W Contrast  08/06/2014   CLINICAL DATA:  Pelvic pain for 2 days. Heavy menstrual period. 5- pregnancy tests.  EXAM: CT ABDOMEN AND PELVIS WITH CONTRAST  TECHNIQUE: Multidetector CT imaging of the abdomen and pelvis was performed using the standard protocol following bolus administration of intravenous contrast.  CONTRAST:  184mL OMNIPAQUE IOHEXOL 300 MG/ML  SOLN  COMPARISON:  Pelvic ultrasound June 24, 2012  FINDINGS: LUNG BASES: Included view of the lung bases are clear. Visualized heart and pericardium are unremarkable.  SOLID ORGANS: The liver, spleen, gallbladder, pancreas and adrenal glands are unremarkable.  GASTROINTESTINAL TRACT: The stomach, small and large bowel are normal in course and caliber without inflammatory changes. The appendix is not discretely identified, however there are no inflammatory changes in the right lower quadrant.  KIDNEYS/ URINARY TRACT: Kidneys are orthotopic, demonstrating symmetric enhancement. No nephrolithiasis, hydronephrosis or solid renal masses. The unopacified ureters are normal in course and caliber. Urinary bladder is decompressed and unremarkable.  PERITONEUM/RETROPERITONEUM: Trace amount of free fluid in the pelvis is likely physiologic. No intraperitoneal free air. Aortoiliac vessels are normal in course and caliber. No lymphadenopathy by CT size criteria. Multiple enhancing masses within the uterus (predominately appearing intramural) consistent with history of fibroids. Largest fibroid measures approximately 4 cm.  SOFT TISSUE/OSSEOUS STRUCTURES: Nonsuspicious.  IMPRESSION: Multiple apparent uterine fibroids. No acute intra-abdominal or pelvic process.   Electronically Signed   By: Elon Alas   On: 08/06/2014 23:10     EKG Interpretation None      MDM   Final diagnoses:  RLQ abdominal pain  Fibroids, submucosal  Menorrhagia with irregular cycle   Pt with known fibroid presents with abdominal discomfort, and  persistent vaginal bleeding. MR shows fibroid or an ultrasound 2013. Patient hemoglobin 13.7, no signs of anemia. Negative for anemia. Pelvic exam shows moderate right-sided suprapubic tenderness likely fibroids plan to discuss with OB for abnormal vaginal bleeding treatment. Decline STD treatment while in ED. And re-evaluate after medication.  8:53 PM Discussed with Dr. Elly Modena who advises Megace 40 mg BID and follow up in clinic.  9:20 PM  Reevaluation patient resting comfortably in room talking on phone. Patient denies resolution of symptoms with Percocet and Toradol injection. Repeat abdomen exam shows increase in tenderness with palpation to right lower quadrant, increased since previous. Plan to CT abdomen to rule out appendicitis. CT negative for acute process multiple fibroids seen. Discussed lab results, imaging results, and treatment plan with the patient. Return precautions given. Reports understanding and no other concerns at this time.  Patient is stable for discharge at this time. Meds given in ED:  Medications  iohexol (OMNIPAQUE) 300 MG/ML solution 25 mL (25 mLs Oral Contrast Given 08/06/14 2219)  promethazine (PHENERGAN) injection 25 mg (0 mg Intravenous Hold 08/06/14 2310)  megestrol (MEGACE) tablet 40 mg (40 mg Oral Given 08/06/14 2350)  oxyCODONE-acetaminophen (PERCOCET/ROXICET) 5-325 MG per tablet 1 tablet (1 tablet Oral Given 08/06/14 1436)  ketorolac (TORADOL) 30 MG/ML injection 30 mg (30 mg Intravenous Given 08/06/14 1936)  sodium chloride 0.9 % bolus 1,000 mL (0 mLs Intravenous Stopped 08/06/14 2344)  morphine 4 MG/ML injection 4 mg (4 mg Intravenous Given 08/06/14 2149)  ondansetron (ZOFRAN) injection 4 mg (4 mg Intravenous Given 08/06/14 2157)  iohexol (OMNIPAQUE) 300 MG/ML solution 100 mL (100 mLs Intravenous Contrast Given 08/06/14 2246)    Discharge Medication List as of 08/06/2014 11:34 PM    START taking these medications   Details  ibuprofen (ADVIL,MOTRIN)  800 MG tablet Take 1 tablet (800 mg total) by mouth 3 (three) times daily., Starting 08/06/2014, Until Discontinued, Print    megestrol (MEGACE) 40 MG tablet Take 1 tablet (40 mg total) by mouth 2 (two) times daily., Starting 08/06/2014, Until Discontinued, Print    oxyCODONE-acetaminophen (PERCOCET) 5-325 MG per tablet Take 1 tablet by mouth every 4 (four) hours as needed., Starting 08/06/2014, Until Discontinued, Print         Harvie Heck, PA-C 08/07/14 [phone]

## 2014-08-06 NOTE — ED Notes (Signed)
Pt states she has had 3 menstrual cycles since the beginning of the month.   Pt also c/o pain, as if someone is "tugging on my uterus".  Pt states she has changed six pads today.  Hx of fibroids.  Has  Taken 5 pregnancy tests which were all negative.

## 2014-08-06 NOTE — Discharge Instructions (Signed)
Call an OB/GYN for further evaluation of your abdominal discomfort and abnormal vaginal bleeding. Call for a follow up appointment with a Family or Primary Care Provider.  Return if Symptoms worsen.   Take medication as prescribed.
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
## 2014-08-07 LAB — GC/CHLAMYDIA PROBE AMP
CT Probe RNA: NEGATIVE
GC Probe RNA: NEGATIVE

## 2014-08-08 NOTE — ED Provider Notes (Signed)
Medical screening examination/treatment/procedure(s) were performed by non-physician practitioner and as supervising physician I was immediately available for consultation/collaboration.   Delora Fuel, MD 57/90/38 [phone]

## 2014-08-22 ENCOUNTER — Encounter: Payer: Self-pay | Admitting: Obstetrics & Gynecology

## 2014-08-22 ENCOUNTER — Ambulatory Visit (INDEPENDENT_AMBULATORY_CARE_PROVIDER_SITE_OTHER): Payer: Self-pay | Admitting: Obstetrics & Gynecology

## 2014-08-22 VITALS — BP 124/80 | HR 86 | Ht 64.0 in | Wt 184.6 lb

## 2014-08-22 DIAGNOSIS — D219 Benign neoplasm of connective and other soft tissue, unspecified: Secondary | ICD-10-CM | POA: Insufficient documentation

## 2014-08-22 DIAGNOSIS — N938 Other specified abnormal uterine and vaginal bleeding: Secondary | ICD-10-CM

## 2014-08-22 DIAGNOSIS — D251 Intramural leiomyoma of uterus: Secondary | ICD-10-CM

## 2014-08-22 DIAGNOSIS — E669 Obesity, unspecified: Secondary | ICD-10-CM

## 2014-08-22 MED ORDER — NORGESTIMATE-ETH ESTRADIOL 0.25-35 MG-MCG PO TABS: 1.0000 | ORAL_TABLET | Freq: Every day | ORAL | Status: AC

## 2014-08-22 NOTE — Progress Notes (Signed)
   Subjective:    Patient ID: Kim Williams, female    DOB: 03/22/1986, 28 y.o.   MRN: [id number]  HPI  This 28 yo S AA G0 is here today as a follow up after an ER visit last week for DUB and dysmenorrhea. She was aware that she has fibroids from an earlier ultrasound. In the ER, a CT was done and it showed a 4 cm fibroid.  She is having unprotected IC but does not desire a pregnancy at this point.  Review of Systems She declines a pap smear today but promises to get one scheduled either through the health dept or [facility] program.    Objective:   Physical Exam  Pt declines an exam      Assessment & Plan:  DUB/dysmenorrhea- I have prescribed generic lo ovral RTC 3 months for BP check and follow up

## 2014-08-22 NOTE — Addendum Note (Signed)
Addended by: Michel Harrow on: 08/22/2014 02:46 PM   Modules accepted: Orders

## 2014-11-29 ENCOUNTER — Emergency Department (HOSPITAL_COMMUNITY): Payer: No Typology Code available for payment source

## 2014-11-29 ENCOUNTER — Encounter (HOSPITAL_COMMUNITY): Payer: Self-pay | Admitting: Emergency Medicine

## 2014-11-29 ENCOUNTER — Emergency Department (HOSPITAL_COMMUNITY)
Admission: EM | Admit: 2014-11-29 | Discharge: 2014-11-29 | Disposition: A | Discharge status: Home / Self Care | Payer: No Typology Code available for payment source | Attending: Emergency Medicine | Admitting: Emergency Medicine

## 2014-11-29 DIAGNOSIS — Y92410 Unspecified street and highway as the place of occurrence of the external cause: Secondary | ICD-10-CM | POA: Insufficient documentation

## 2014-11-29 DIAGNOSIS — S299XXA Unspecified injury of thorax, initial encounter: Secondary | ICD-10-CM | POA: Diagnosis not present

## 2014-11-29 DIAGNOSIS — Z8742 Personal history of other diseases of the female genital tract: Secondary | ICD-10-CM | POA: Diagnosis not present

## 2014-11-29 DIAGNOSIS — Z791 Long term (current) use of non-steroidal anti-inflammatories (NSAID): Secondary | ICD-10-CM | POA: Diagnosis not present

## 2014-11-29 DIAGNOSIS — Y9389 Activity, other specified: Secondary | ICD-10-CM | POA: Diagnosis not present

## 2014-11-29 DIAGNOSIS — Z720 Tobacco use: Secondary | ICD-10-CM | POA: Diagnosis not present

## 2014-11-29 DIAGNOSIS — Z8659 Personal history of other mental and behavioral disorders: Secondary | ICD-10-CM | POA: Insufficient documentation

## 2014-11-29 DIAGNOSIS — Z3202 Encounter for pregnancy test, result negative: Secondary | ICD-10-CM | POA: Diagnosis not present

## 2014-11-29 DIAGNOSIS — Z79899 Other long term (current) drug therapy: Secondary | ICD-10-CM | POA: Insufficient documentation

## 2014-11-29 DIAGNOSIS — Z793 Long term (current) use of hormonal contraceptives: Secondary | ICD-10-CM | POA: Diagnosis not present

## 2014-11-29 DIAGNOSIS — R0789 Other chest pain: Secondary | ICD-10-CM

## 2014-11-29 DIAGNOSIS — Y998 Other external cause status: Secondary | ICD-10-CM | POA: Diagnosis not present

## 2014-11-29 DIAGNOSIS — J45909 Unspecified asthma, uncomplicated: Secondary | ICD-10-CM | POA: Insufficient documentation

## 2014-11-29 DIAGNOSIS — I10 Essential (primary) hypertension: Secondary | ICD-10-CM | POA: Insufficient documentation

## 2014-11-29 LAB — BASIC METABOLIC PANEL
ANION GAP: 4 — AB (ref 5–15)
BUN: 9 mg/dL (ref 6–23)
CO2: 24 mmol/L (ref 19–32)
Calcium: 9 mg/dL (ref 8.4–10.5)
Chloride: 108 mmol/L (ref 96–112)
Creatinine, Ser: 0.8 mg/dL (ref 0.50–1.10)
GFR calc non Af Amer: >90 mL/min (ref >90)
Glucose, Bld: 83 mg/dL (ref 70–99)
Potassium: 4 mmol/L (ref 3.5–5.1)
Sodium: 136 mmol/L (ref 135–145)

## 2014-11-29 LAB — CBC WITH DIFFERENTIAL/PLATELET
BASOS PCT: 0 % (ref 0–1)
Basophils Absolute: 0 10*3/uL (ref 0.0–0.1)
EOS ABS: 0.2 10*3/uL (ref 0.0–0.7)
Eosinophils Relative: 2 % (ref 0–5)
HEMATOCRIT: 39.8 % (ref 36.0–46.0)
Hemoglobin: 13.3 g/dL (ref 12.0–15.0)
Lymphocytes Relative: 34 % (ref 12–46)
Lymphs Abs: 2.4 10*3/uL (ref 0.7–4.0)
MCH: 26.4 pg (ref 26.0–34.0)
MCHC: 33.4 g/dL (ref 30.0–36.0)
MCV: 79 fL (ref 78.0–100.0)
MONO ABS: 0.6 10*3/uL (ref 0.1–1.0)
Monocytes Relative: 9 % (ref 3–12)
NEUTROS ABS: 4 10*3/uL (ref 1.7–7.7)
Neutrophils Relative %: 55 % (ref 43–77)
Platelets: 238 10*3/uL (ref 150–400)
RBC: 5.04 MIL/uL (ref 3.87–5.11)
RDW: 13.9 % (ref 11.5–15.5)
WBC: 7.2 10*3/uL (ref 4.0–10.5)

## 2014-11-29 LAB — I-STAT CHEM 8, ED
BUN: 9 mg/dL (ref 6–23)
CALCIUM ION: 1.18 mmol/L (ref 1.12–1.23)
CHLORIDE: 104 mmol/L (ref 96–112)
CREATININE: 0.8 mg/dL (ref 0.50–1.10)
GLUCOSE: 85 mg/dL (ref 70–99)
HCT: 44 % (ref 36.0–46.0)
Hemoglobin: 15 g/dL (ref 12.0–15.0)
Potassium: 3.9 mmol/L (ref 3.5–5.1)
Sodium: 140 mmol/L (ref 135–145)
TCO2: 21 mmol/L (ref 0–100)

## 2014-11-29 LAB — POC URINE PREG, ED: Preg Test, Ur: NEGATIVE

## 2014-11-29 MED ORDER — ONDANSETRON HCL 4 MG/2ML IJ SOLN
4.0000 mg | Freq: Once | INTRAMUSCULAR | Status: AC
  Administered 2014-11-29: 4 mg via INTRAVENOUS

## 2014-11-29 MED ORDER — SODIUM CHLORIDE 0.9 % IV SOLN
INTRAVENOUS | Status: DC
Start: 2014-11-29 — End: 2014-11-29

## 2014-11-29 MED ORDER — HYDROCODONE-ACETAMINOPHEN 5-325 MG PO TABS: 1.0000 | ORAL_TABLET | ORAL | Status: AC | PRN

## 2014-11-29 MED ORDER — FENTANYL CITRATE 0.05 MG/ML IJ SOLN
25.0000 ug | Freq: Once | INTRAMUSCULAR | Status: AC
  Administered 2014-11-29: 25 ug via INTRAVENOUS

## 2014-11-29 MED ORDER — IOHEXOL 300 MG/ML  SOLN
100.0000 mL | Freq: Once | INTRAMUSCULAR | Status: AC | PRN
  Administered 2014-11-29: 100 mL via INTRAVENOUS

## 2014-11-29 MED ORDER — FENTANYL CITRATE 0.05 MG/ML IJ SOLN
25.0000 ug | Freq: Once | INTRAMUSCULAR | Status: AC
  Administered 2014-11-29: 25 ug via INTRAVENOUS
  Filled 2014-11-29: qty 2

## 2014-11-29 MED ORDER — SODIUM CHLORIDE 0.9 % IV BOLUS (SEPSIS)
500.0000 mL | Freq: Once | INTRAVENOUS | Status: AC
  Administered 2014-11-29: 500 mL via INTRAVENOUS

## 2014-11-29 MED ORDER — FENTANYL CITRATE 0.05 MG/ML IJ SOLN
INTRAMUSCULAR | Status: AC
  Filled 2014-11-29: qty 2

## 2014-11-29 MED ORDER — ONDANSETRON HCL 4 MG/2ML IJ SOLN
INTRAMUSCULAR | Status: AC
  Filled 2014-11-29: qty 2
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
## 2014-11-29 NOTE — Discharge Instructions (Signed)
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
## 2014-11-29 NOTE — ED Notes (Signed)
Bari Mantis [phone] caretaker

## 2014-12-02 ENCOUNTER — Emergency Department (HOSPITAL_COMMUNITY): Payer: No Typology Code available for payment source

## 2014-12-02 ENCOUNTER — Encounter (HOSPITAL_COMMUNITY): Payer: Self-pay | Admitting: *Deleted

## 2014-12-02 ENCOUNTER — Emergency Department (HOSPITAL_COMMUNITY)
Admission: EM | Admit: 2014-12-02 | Discharge: 2014-12-02 | Disposition: A | Discharge status: Home / Self Care | Payer: No Typology Code available for payment source | Attending: Emergency Medicine | Admitting: Emergency Medicine

## 2014-12-02 DIAGNOSIS — Z8742 Personal history of other diseases of the female genital tract: Secondary | ICD-10-CM | POA: Insufficient documentation

## 2014-12-02 DIAGNOSIS — Z791 Long term (current) use of non-steroidal anti-inflammatories (NSAID): Secondary | ICD-10-CM | POA: Diagnosis not present

## 2014-12-02 DIAGNOSIS — G8911 Acute pain due to trauma: Secondary | ICD-10-CM | POA: Diagnosis not present

## 2014-12-02 DIAGNOSIS — J45909 Unspecified asthma, uncomplicated: Secondary | ICD-10-CM | POA: Diagnosis not present

## 2014-12-02 DIAGNOSIS — M546 Pain in thoracic spine: Secondary | ICD-10-CM | POA: Diagnosis not present

## 2014-12-02 DIAGNOSIS — F419 Anxiety disorder, unspecified: Secondary | ICD-10-CM | POA: Diagnosis not present

## 2014-12-02 DIAGNOSIS — Z793 Long term (current) use of hormonal contraceptives: Secondary | ICD-10-CM | POA: Insufficient documentation

## 2014-12-02 DIAGNOSIS — Z720 Tobacco use: Secondary | ICD-10-CM | POA: Insufficient documentation

## 2014-12-02 DIAGNOSIS — M545 Low back pain: Secondary | ICD-10-CM | POA: Insufficient documentation

## 2014-12-02 DIAGNOSIS — I10 Essential (primary) hypertension: Secondary | ICD-10-CM | POA: Diagnosis not present

## 2014-12-02 DIAGNOSIS — Z3202 Encounter for pregnancy test, result negative: Secondary | ICD-10-CM | POA: Diagnosis not present

## 2014-12-02 LAB — POC URINE PREG, ED: Preg Test, Ur: NEGATIVE

## 2014-12-02 MED ORDER — DIAZEPAM 5 MG PO TABS: 5.0000 mg | ORAL_TABLET | Freq: Four times a day (QID) | ORAL | Status: AC | PRN

## 2014-12-02 MED ORDER — DIAZEPAM 5 MG PO TABS
5.0000 mg | ORAL_TABLET | Freq: Once | ORAL | Status: AC
  Administered 2014-12-02: 5 mg via ORAL
  Filled 2014-12-02: qty 1

## 2014-12-02 NOTE — Discharge Instructions (Signed)

## 2014-12-02 NOTE — ED Notes (Signed)
Patient transported to CT 

## 2014-12-02 NOTE — ED Notes (Addendum)
Pt returned from xray
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
## 2014-12-02 NOTE — ED Notes (Signed)
Patient transported to X-ray 
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
## 2015-03-16 ENCOUNTER — Encounter (HOSPITAL_COMMUNITY): Payer: Self-pay | Admitting: *Deleted

## 2015-03-16 ENCOUNTER — Emergency Department (HOSPITAL_COMMUNITY)
Admission: EM | Admit: 2015-03-16 | Discharge: 2015-03-16 | Disposition: A | Discharge status: Home / Self Care | Payer: Self-pay | Attending: Emergency Medicine | Admitting: Emergency Medicine

## 2015-03-16 DIAGNOSIS — R51 Headache: Secondary | ICD-10-CM | POA: Insufficient documentation

## 2015-03-16 DIAGNOSIS — Z720 Tobacco use: Secondary | ICD-10-CM | POA: Insufficient documentation

## 2015-03-16 DIAGNOSIS — Z86018 Personal history of other benign neoplasm: Secondary | ICD-10-CM | POA: Insufficient documentation

## 2015-03-16 DIAGNOSIS — I10 Essential (primary) hypertension: Secondary | ICD-10-CM | POA: Insufficient documentation

## 2015-03-16 DIAGNOSIS — Z8742 Personal history of other diseases of the female genital tract: Secondary | ICD-10-CM | POA: Insufficient documentation

## 2015-03-16 DIAGNOSIS — F419 Anxiety disorder, unspecified: Secondary | ICD-10-CM | POA: Insufficient documentation

## 2015-03-16 DIAGNOSIS — J45909 Unspecified asthma, uncomplicated: Secondary | ICD-10-CM | POA: Insufficient documentation

## 2015-03-16 DIAGNOSIS — R519 Headache, unspecified: Secondary | ICD-10-CM

## 2015-03-16 DIAGNOSIS — R112 Nausea with vomiting, unspecified: Secondary | ICD-10-CM | POA: Insufficient documentation

## 2015-03-16 DIAGNOSIS — M791 Myalgia: Secondary | ICD-10-CM | POA: Insufficient documentation

## 2015-03-16 MED ORDER — DEXAMETHASONE SODIUM PHOSPHATE 10 MG/ML IJ SOLN
10.0000 mg | Freq: Once | INTRAMUSCULAR | Status: AC
  Administered 2015-03-16: 10 mg via INTRAVENOUS
  Filled 2015-03-16: qty 1

## 2015-03-16 MED ORDER — KETOROLAC TROMETHAMINE 30 MG/ML IJ SOLN
30.0000 mg | Freq: Once | INTRAMUSCULAR | Status: AC
  Administered 2015-03-16: 30 mg via INTRAVENOUS
  Filled 2015-03-16: qty 1

## 2015-03-16 MED ORDER — ONDANSETRON 4 MG PO TBDP: 8.0000 mg | ORAL_TABLET | Freq: Once | ORAL | Status: DC

## 2015-03-16 MED ORDER — METOCLOPRAMIDE HCL 5 MG/ML IJ SOLN
10.0000 mg | Freq: Once | INTRAMUSCULAR | Status: AC
  Administered 2015-03-16: 10 mg via INTRAVENOUS
  Filled 2015-03-16: qty 2

## 2015-03-16 MED ORDER — DIPHENHYDRAMINE HCL 50 MG/ML IJ SOLN
50.0000 mg | Freq: Once | INTRAMUSCULAR | Status: AC
  Administered 2015-03-16: 50 mg via INTRAVENOUS
  Filled 2015-03-16: qty 1

## 2015-03-16 NOTE — ED Provider Notes (Signed)
CSN: [id number]     Arrival date & time 03/16/15  1538 History   First MD Initiated Contact with Patient 03/16/15 1737     Chief Complaint  Patient presents with  . Migraine  . Emesis     (Consider location/radiation/quality/duration/timing/severity/associated sxs/prior Treatment) HPI Kim Williams is a 29 y.o. female because of her evaluation of migraine and nausea with vomiting. Patient states she woke up this morning with a migraine at approximately 8 AM, typical of previous migraines "just more intense". She reports her discomfort is localized over her bilateral forehead in front of her face. She reports associated nausea and vomiting with body aches. Coughing, sneezing exacerbate her migraine symptoms. Nothing makes it better. Rates her discomfort as severe. No neck stiffness or rigidity. Reports a fever of 99.1. Denies vision changes, chest pain, shortness of breath, rash.  Past Medical History  Diagnosis Date  . Anxiety   . Asthma   . Hypertension   . Fibroids   . Multiple developmental ovarian cysts    History reviewed. No pertinent past surgical history. History reviewed. No pertinent family history. History  Substance Use Topics  . Smoking status: Current Some Day Smoker    Types: Cigarettes  . Smokeless tobacco: Not on file  . Alcohol Use: Yes   OB History    Gravida Para Term Preterm AB TAB SAB Ectopic Multiple Living   0 0 0 0 0 0 0 0 0 0      Review of Systems A 10 point review of systems was completed and was negative except for pertinent positives and negatives as mentioned in the history of present illness     Allergies  Review of patient's allergies indicates no known allergies.  Home Medications   Prior to Admission medications   Medication Sig Start Date End Date Taking? Authorizing Provider  diazepam (VALIUM) 5 MG tablet Take 1 tablet (5 mg total) by mouth every 6 (six) hours as needed (spasms). 12/02/14   Debby Freiberg, MD   HYDROcodone-acetaminophen (NORCO/VICODIN) 5-325 MG per tablet Take 1 tablet by mouth every 4 (four) hours as needed. Patient taking differently: Take 1 tablet by mouth every 4 (four) hours as needed for moderate pain.  11/29/14   Sinda Du, MD  ibuprofen (ADVIL,MOTRIN) 800 MG tablet Take 1 tablet (800 mg total) by mouth 3 (three) times daily. Patient not taking: Reported on 12/02/2014 08/06/14   Harvie Heck, PA-C  megestrol (MEGACE) 40 MG tablet Take 1 tablet (40 mg total) by mouth 2 (two) times daily. Patient not taking: Reported on 11/29/2014 08/06/14   Harvie Heck, PA-C  norgestimate-ethinyl estradiol (ORTHO-CYCLEN,SPRINTEC,PREVIFEM) 0.25-35 MG-MCG tablet Take 1 tablet by mouth daily. Patient not taking: Reported on 11/29/2014 08/22/14   Emily Filbert, MD  oxyCODONE-acetaminophen (PERCOCET) 5-325 MG per tablet Take 1 tablet by mouth every 4 (four) hours as needed. Patient not taking: Reported on 11/29/2014 08/06/14   Harvie Heck, PA-C   BP 135/90 mmHg  Pulse 71  Temp(Src) 99.5 F (37.5 C) (Oral)  Resp 18  SpO2 100%  LMP 03/05/2015 Physical Exam  Constitutional: She is oriented to person, place, and time. She appears well-developed and well-nourished.  HENT:  Head: Normocephalic and atraumatic.  Mouth/Throat: Oropharynx is clear and moist.  Eyes: Conjunctivae are normal. Pupils are equal, round, and reactive to light. Right eye exhibits no discharge. Left eye exhibits no discharge. No scleral icterus.  Neck: Neck supple.  No meningismus or nuchal rigidity.  Cardiovascular: Normal rate, regular rhythm and  normal heart sounds.   Pulmonary/Chest: Effort normal and breath sounds normal. No respiratory distress. She has no wheezes. She has no rales.  Abdominal: Soft. There is no tenderness.  Musculoskeletal: She exhibits no tenderness.  Neurological: She is alert and oriented to person, place, and time.  Cranial Nerves II-XII grossly intact. Motor and sensory are intact and appear  baseline for patient. Gait is baseline without ataxia. Patient ambulates without difficulty to and from the restroom.  Skin: Skin is warm and dry. No rash noted.  Psychiatric: She has a normal mood and affect.  Nursing note and vitals reviewed.   ED Course  Procedures (including critical care time) Labs Review Labs Reviewed - No data to display  Imaging Review No results found.   EKG Interpretation None     Meds given in ED:  Medications  dexamethasone (DECADRON) injection 10 mg (10 mg Intravenous Given 03/16/15 1827)  diphenhydrAMINE (BENADRYL) injection 50 mg (50 mg Intravenous Given 03/16/15 1825)  metoCLOPramide (REGLAN) injection 10 mg (10 mg Intravenous Given 03/16/15 1829)  ketorolac (TORADOL) 30 MG/ML injection 30 mg (30 mg Intravenous Given 03/16/15 2013)    New Prescriptions   No medications on file   Filed Vitals:   03/16/15 1800 03/16/15 1830 03/16/15 1900 03/16/15 2000  BP: 136/99 153/102 142/88 135/90  Pulse: 65 92 71 71  Temp:      TempSrc:      Resp:   18   SpO2: 100% 100% 99% 100%    MDM  Vitals stable, afebrile Feels better after analgesia in ED. Normal neurological exam. No dizziness. Gait baseline. HA gradual in onset, progressively worsening. Similar to previous HA No hx of traumatic injury No hx of clotting disorder, peripartum or postpartum state, Immunocompromise Doubt Meningitis, CVA/SAH/ICH, Dural Venous Thrombosis, Eclampsia, Seizure, Central Lesion or Tumor, Giant Cell Arteritis, Cervical/Carotid or other vascular dissection. I have personally reviewed all labs, imaging, nursing/prvious notes during the patient's evaluation in the ED today. No evidence of other acute or emergent pathology that requires immediate intervention at this time. Pt stable, in good condition and is appropriate for discharge. DC with instructions to follow up with PCP within 48 hrs for further evaluation and management of symptoms.  Final diagnoses:  Acute  nonintractable headache, unspecified headache type        Comer Locket, PA-C 03/17/15 1457  Ezequiel Essex, MD 03/17/15 2325

## 2015-03-16 NOTE — ED Notes (Signed)
Pt nauseated but states she can not take zofran at triage due to empty stomach.

## 2015-03-16 NOTE — ED Notes (Signed)
Pt placed in a gown with BP cuff on and pulse ox

## 2015-03-16 NOTE — Discharge Instructions (Signed)
You were evaluated in the ED today for your headache. There does not appear to be an emergent cause for her headache at this time. You reported increasing relief after administration of medications in the ED. It is important to follow up with primary care for further evaluation and management of your symptoms. Return to ED for new or worsening symptoms.  Headaches, Frequently Asked Questions MIGRAINE HEADACHES Q: What is migraine? What causes it? How can I treat it? A: Generally, migraine headaches begin as a dull ache. Then they develop into a constant, throbbing, and pulsating pain. You may experience pain at the temples. You may experience pain at the front or back of one or both sides of the head. The pain is usually accompanied by a combination of:  Nausea.  Vomiting.  Sensitivity to light and noise. Some people (about 15%) experience an aura (see below) before an attack. The cause of migraine is believed to be chemical reactions in the brain. Treatment for migraine may include over-the-counter or prescription medications. It may also include self-help techniques. These include relaxation training and biofeedback.  Q: What is an aura? A: About 15% of people with migraine get an "aura". This is a sign of neurological symptoms that occur before a migraine headache. You may see wavy or jagged lines, dots, or flashing lights. You might experience tunnel vision or blind spots in one or both eyes. The aura can include visual or auditory hallucinations (something imagined). It may include disruptions in smell (such as strange odors), taste or touch. Other symptoms include:  Numbness.  A "pins and needles" sensation.  Difficulty in recalling or speaking the correct word. These neurological events may last as long as 60 minutes. These symptoms will fade as the headache begins. Q: What is a trigger? A: Certain physical or environmental factors can lead to or "trigger" a migraine. These  include:  Foods.  Hormonal changes.  Weather.  Stress. It is important to remember that triggers are different for everyone. To help prevent migraine attacks, you need to figure out which triggers affect you. Keep a headache diary. This is a good way to track triggers. The diary will help you talk to your healthcare professional about your condition. Q: Does weather affect migraines? A: Bright sunshine, hot, humid conditions, and drastic changes in barometric pressure may lead to, or "trigger," a migraine attack in some people. But studies have shown that weather does not act as a trigger for everyone with migraines. Q: What is the link between migraine and hormones? A: Hormones start and regulate many of your body's functions. Hormones keep your body in balance within a constantly changing environment. The levels of hormones in your body are unbalanced at times. Examples are during menstruation, pregnancy, or menopause. That can lead to a migraine attack. In fact, about three quarters of all women with migraine report that their attacks are related to the menstrual cycle.  Q: Is there an increased risk of stroke for migraine sufferers? A: The likelihood of a migraine attack causing a stroke is very remote. That is not to say that migraine sufferers cannot have a stroke associated with their migraines. In persons under age 37, the most common associated factor for stroke is migraine headache. But over the course of a person's normal life span, the occurrence of migraine headache may actually be associated with a reduced risk of dying from cerebrovascular disease due to stroke.  Q: What are acute medications for migraine? A: Acute medications are  used to treat the pain of the headache after it has started. Examples over-the-counter medications, NSAIDs, ergots, and triptans.  Q: What are the triptans? A: Triptans are the newest class of abortive medications. They are specifically targeted to treat  migraine. Triptans are vasoconstrictors. They moderate some chemical reactions in the brain. The triptans work on receptors in your brain. Triptans help to restore the balance of a neurotransmitter called serotonin. Fluctuations in levels of serotonin are thought to be a main cause of migraine.  Q: Are over-the-counter medications for migraine effective? A: Over-the-counter, or "OTC," medications may be effective in relieving mild to moderate pain and associated symptoms of migraine. But you should see your caregiver before beginning any treatment regimen for migraine.  Q: What are preventive medications for migraine? A: Preventive medications for migraine are sometimes referred to as "prophylactic" treatments. They are used to reduce the frequency, severity, and length of migraine attacks. Examples of preventive medications include antiepileptic medications, antidepressants, beta-blockers, calcium channel blockers, and NSAIDs (nonsteroidal anti-inflammatory drugs). Q: Why are anticonvulsants used to treat migraine? A: During the past few years, there has been an increased interest in antiepileptic drugs for the prevention of migraine. They are sometimes referred to as "anticonvulsants". Both epilepsy and migraine may be caused by similar reactions in the brain.  Q: Why are antidepressants used to treat migraine? A: Antidepressants are typically used to treat people with depression. They may reduce migraine frequency by regulating chemical levels, such as serotonin, in the brain.  Q: What alternative therapies are used to treat migraine? A: The term "alternative therapies" is often used to describe treatments considered outside the scope of conventional Western medicine. Examples of alternative therapy include acupuncture, acupressure, and yoga. Another common alternative treatment is herbal therapy. Some herbs are believed to relieve headache pain. Always discuss alternative therapies with your caregiver  before proceeding. Some herbal products contain arsenic and other toxins. TENSION HEADACHES Q: What is a tension-type headache? What causes it? How can I treat it? A: Tension-type headaches occur randomly. They are often the result of temporary stress, anxiety, fatigue, or anger. Symptoms include soreness in your temples, a tightening band-like sensation around your head (a "vice-like" ache). Symptoms can also include a pulling feeling, pressure sensations, and contracting head and neck muscles. The headache begins in your forehead, temples, or the back of your head and neck. Treatment for tension-type headache may include over-the-counter or prescription medications. Treatment may also include self-help techniques such as relaxation training and biofeedback. CLUSTER HEADACHES Q: What is a cluster headache? What causes it? How can I treat it? A: Cluster headache gets its name because the attacks come in groups. The pain arrives with little, if any, warning. It is usually on one side of the head. A tearing or bloodshot eye and a runny nose on the same side of the headache may also accompany the pain. Cluster headaches are believed to be caused by chemical reactions in the brain. They have been described as the most severe and intense of any headache type. Treatment for cluster headache includes prescription medication and oxygen. SINUS HEADACHES Q: What is a sinus headache? What causes it? How can I treat it? A: When a cavity in the bones of the face and skull (a sinus) becomes inflamed, the inflammation will cause localized pain. This condition is usually the result of an allergic reaction, a tumor, or an infection. If your headache is caused by a sinus blockage, such as an infection, you will  probably have a fever. An x-ray will confirm a sinus blockage. Your caregiver's treatment might include antibiotics for the infection, as well as antihistamines or decongestants.  REBOUND HEADACHES Q: What is a  rebound headache? What causes it? How can I treat it? A: A pattern of taking acute headache medications too often can lead to a condition known as "rebound headache." A pattern of taking too much headache medication includes taking it more than 2 days per week or in excessive amounts. That means more than the label or a caregiver advises. With rebound headaches, your medications not only stop relieving pain, they actually begin to cause headaches. Doctors treat rebound headache by tapering the medication that is being overused. Sometimes your caregiver will gradually substitute a different type of treatment or medication. Stopping may be a challenge. Regularly overusing a medication increases the potential for serious side effects. Consult a caregiver if you regularly use headache medications more than 2 days per week or more than the label advises. ADDITIONAL QUESTIONS AND ANSWERS Q: What is biofeedback? A: Biofeedback is a self-help treatment. Biofeedback uses special equipment to monitor your body's involuntary physical responses. Biofeedback monitors:  Breathing.  Pulse.  Heart rate.  Temperature.  Muscle tension.  Brain activity. Biofeedback helps you refine and perfect your relaxation exercises. You learn to control the physical responses that are related to stress. Once the technique has been mastered, you do not need the equipment any more. Q: Are headaches hereditary? A: Four out of five (80%) of people that suffer report a family history of migraine. Scientists are not sure if this is genetic or a family predisposition. Despite the uncertainty, a child has a 50% chance of having migraine if one parent suffers. The child has a 75% chance if both parents suffer.  Q: Can children get headaches? A: By the time they reach high school, most young people have experienced some type of headache. Many safe and effective approaches or medications can prevent a headache from occurring or stop it  after it has begun.  Q: What type of doctor should I see to diagnose and treat my headache? A: Start with your primary caregiver. Discuss his or her experience and approach to headaches. Discuss methods of classification, diagnosis, and treatment. Your caregiver may decide to recommend you to a headache specialist, depending upon your symptoms or other physical conditions. Having diabetes, allergies, etc., may require a more comprehensive and inclusive approach to your headache. The [employer] will provide, upon request, a list of [facility] physician members in your state. Document Released: 12/19/2003 Document Revised: 12/21/2011 Document Reviewed: 05/28/2008 [facility] Patient Information 2015 [place], [place]. This information is not intended to replace advice given to you by your health care provider. Make sure you discuss any questions you have with your health care provider.

## 2015-03-16 NOTE — ED Notes (Signed)
Pt reports having headache since getting up this am. Having n/v and chills, bodyaches. No acute distress noted at triage.

## 2015-03-18 ENCOUNTER — Telehealth (HOSPITAL_BASED_OUTPATIENT_CLINIC_OR_DEPARTMENT_OTHER): Payer: Self-pay | Admitting: Emergency Medicine

## 2015-04-29 ENCOUNTER — Ambulatory Visit: Payer: Self-pay
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
